# Patient Record
Sex: Female | Born: 1935 | Race: White | Hispanic: No | Smoking: Former smoker
Health system: Southern US, Community
[De-identification: ages and names within clinical notes are randomized; demographics above are authoritative.]

## PROBLEM LIST (undated history)

## (undated) DIAGNOSIS — J449 Chronic obstructive pulmonary disease, unspecified: Secondary | ICD-10-CM

## (undated) DIAGNOSIS — E785 Hyperlipidemia, unspecified: Secondary | ICD-10-CM

## (undated) DIAGNOSIS — J302 Other seasonal allergic rhinitis: Secondary | ICD-10-CM

## (undated) DIAGNOSIS — I10 Essential (primary) hypertension: Secondary | ICD-10-CM

## (undated) HISTORY — DX: Essential (primary) hypertension: I10

## (undated) HISTORY — DX: Other seasonal allergic rhinitis: J30.2

---
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
## 1973-05-29 HISTORY — PX: TOTAL ABDOMINAL HYSTERECTOMY: SHX209

## 1999-11-21 ENCOUNTER — Encounter: Payer: Self-pay | Admitting: Family Medicine

## 1999-11-21 ENCOUNTER — Encounter: Admission: RE | Admit: 1999-11-21 | Discharge: 1999-11-21 | Payer: Self-pay | Admitting: Family Medicine

## 2000-11-28 ENCOUNTER — Encounter: Admission: RE | Admit: 2000-11-28 | Discharge: 2000-11-28 | Payer: Self-pay | Admitting: Family Medicine

## 2000-11-28 ENCOUNTER — Encounter: Payer: Self-pay | Admitting: Family Medicine

## 2003-03-06 ENCOUNTER — Encounter: Admission: RE | Admit: 2003-03-06 | Discharge: 2003-03-06 | Payer: Self-pay | Admitting: Family Medicine

## 2003-03-06 ENCOUNTER — Encounter: Payer: Self-pay | Admitting: Family Medicine

## 2005-10-06 ENCOUNTER — Ambulatory Visit (HOSPITAL_COMMUNITY): Admission: RE | Admit: 2005-10-06 | Discharge: 2005-10-06 | Payer: Self-pay | Admitting: Cardiology

## 2007-09-06 ENCOUNTER — Encounter: Admission: RE | Admit: 2007-09-06 | Discharge: 2007-09-06 | Payer: Self-pay | Admitting: Obstetrics and Gynecology

## 2008-02-27 HISTORY — PX: CATARACT EXTRACTION: SUR2

## 2008-04-01 ENCOUNTER — Emergency Department (HOSPITAL_COMMUNITY): Admission: EM | Admit: 2008-04-01 | Discharge: 2008-04-01 | Payer: Self-pay | Admitting: Emergency Medicine

## 2009-12-05 ENCOUNTER — Inpatient Hospital Stay (HOSPITAL_COMMUNITY): Admission: EM | Admit: 2009-12-05 | Discharge: 2009-12-08 | Payer: Self-pay | Admitting: Emergency Medicine

## 2009-12-07 ENCOUNTER — Encounter (INDEPENDENT_AMBULATORY_CARE_PROVIDER_SITE_OTHER): Payer: Self-pay | Admitting: Internal Medicine

## 2010-06-18 ENCOUNTER — Encounter: Payer: Self-pay | Admitting: Family Medicine

## 2010-08-14 LAB — CBC
HCT: 39.3 % (ref 36.0–46.0)
HCT: 43.3 % (ref 36.0–46.0)
HCT: 39.5 % (ref 36.0–46.0)
Hemoglobin: 13.6 g/dL (ref 12.0–15.0)
Hemoglobin: 14.8 g/dL (ref 12.0–15.0)
Hemoglobin: 13.3 g/dL (ref 12.0–15.0)
MCH: 32.2 pg (ref 26.0–34.0)
MCH: 32.5 pg (ref 26.0–34.0)
MCH: 31.9 pg (ref 26.0–34.0)
MCHC: 34.1 g/dL (ref 30.0–36.0)
MCHC: 34.5 g/dL (ref 30.0–36.0)
MCHC: 33.6 g/dL (ref 30.0–36.0)
MCV: 94 fL (ref 78.0–100.0)
MCV: 94.4 fL (ref 78.0–100.0)
MCV: 95 fL (ref 78.0–100.0)
Platelets: 209 10*3/uL (ref 150–400)
Platelets: 225 10*3/uL (ref 150–400)
Platelets: 200 10*3/uL (ref 150–400)
RBC: 4.18 MIL/uL (ref 3.87–5.11)
RBC: 4.58 MIL/uL (ref 3.87–5.11)
RBC: 4.16 MIL/uL (ref 3.87–5.11)
RDW: 13 % (ref 11.5–15.5)
RDW: 13.1 % (ref 11.5–15.5)
RDW: 13.4 % (ref 11.5–15.5)
WBC: 9.4 10*3/uL (ref 4.0–10.5)
WBC: 9.6 10*3/uL (ref 4.0–10.5)
WBC: 12.2 10*3/uL — ABNORMAL HIGH (ref 4.0–10.5)

## 2010-08-14 LAB — DIFFERENTIAL
Basophils Absolute: 0 10*3/uL (ref 0.0–0.1)
Basophils Absolute: 0 10*3/uL (ref 0.0–0.1)
Basophils Relative: 1 % (ref 0–1)
Basophils Relative: 0 % (ref 0–1)
Eosinophils Absolute: 0.1 10*3/uL (ref 0.0–0.7)
Eosinophils Relative: 1 % (ref 0–5)
Lymphocytes Relative: 17 % (ref 12–46)
Lymphocytes Relative: 18 % (ref 12–46)
Lymphs Abs: 1.7 10*3/uL (ref 0.7–4.0)
Monocytes Absolute: 0.7 10*3/uL (ref 0.1–1.0)
Monocytes Relative: 7 % (ref 3–12)
Neutro Abs: 7.1 10*3/uL (ref 1.7–7.7)
Neutro Abs: 9.1 10*3/uL — ABNORMAL HIGH (ref 1.7–7.7)
Neutrophils Relative %: 74 % (ref 43–77)
Neutrophils Relative %: 74 % (ref 43–77)

## 2010-08-14 LAB — POCT I-STAT, CHEM 8
BUN: 15 mg/dL (ref 6–23)
Calcium, Ion: 1.14 mmol/L (ref 1.12–1.32)
Chloride: 100 meq/L (ref 96–112)
Creatinine, Ser: 1.1 mg/dL (ref 0.4–1.2)
Glucose, Bld: 87 mg/dL (ref 70–99)
HCT: 42 % (ref 36.0–46.0)
Hemoglobin: 14.3 g/dL (ref 12.0–15.0)
Potassium: 4.5 meq/L (ref 3.5–5.1)
Sodium: 133 meq/L — ABNORMAL LOW (ref 135–145)
TCO2: 26 mmol/L (ref 0–100)

## 2010-08-14 LAB — COMPREHENSIVE METABOLIC PANEL WITH GFR
ALT: 19 U/L (ref 0–35)
AST: 27 U/L (ref 0–37)
Albumin: 3.6 g/dL (ref 3.5–5.2)
Alkaline Phosphatase: 77 U/L (ref 39–117)
BUN: 13 mg/dL (ref 6–23)
CO2: 27 meq/L (ref 19–32)
Calcium: 9.2 mg/dL (ref 8.4–10.5)
Chloride: 99 meq/L (ref 96–112)
Creatinine, Ser: 0.95 mg/dL (ref 0.4–1.2)
GFR calc non Af Amer: 58 mL/min — ABNORMAL LOW
Glucose, Bld: 95 mg/dL (ref 70–99)
Potassium: 4.1 meq/L (ref 3.5–5.1)
Sodium: 134 meq/L — ABNORMAL LOW (ref 135–145)
Total Bilirubin: 0.6 mg/dL (ref 0.3–1.2)
Total Protein: 6.3 g/dL (ref 6.0–8.3)

## 2010-08-14 LAB — RENAL FUNCTION PANEL
Albumin: 3.2 g/dL — ABNORMAL LOW (ref 3.5–5.2)
BUN: 17 mg/dL (ref 6–23)
CO2: 26 meq/L (ref 19–32)
Calcium: 8.7 mg/dL (ref 8.4–10.5)
Chloride: 100 meq/L (ref 96–112)
Creatinine, Ser: 0.84 mg/dL (ref 0.4–1.2)
GFR calc non Af Amer: >60 mL/min
Glucose, Bld: 131 mg/dL — ABNORMAL HIGH (ref 70–99)
Phosphorus: 2.8 mg/dL (ref 2.3–4.6)
Potassium: 4.3 meq/L (ref 3.5–5.1)
Sodium: 133 meq/L — ABNORMAL LOW (ref 135–145)

## 2010-08-14 LAB — LIPID PANEL
HDL: 58 mg/dL (ref >39)
Total CHOL/HDL Ratio: 3.2 RATIO

## 2010-08-14 LAB — BASIC METABOLIC PANEL WITH GFR
BUN: 16 mg/dL (ref 6–23)
CO2: 28 meq/L (ref 19–32)
Calcium: 8.8 mg/dL (ref 8.4–10.5)
Chloride: 102 meq/L (ref 96–112)
Creatinine, Ser: 0.74 mg/dL (ref 0.4–1.2)
GFR calc non Af Amer: >60 mL/min
Glucose, Bld: 88 mg/dL (ref 70–99)
Potassium: 4.2 meq/L (ref 3.5–5.1)
Sodium: 135 meq/L (ref 135–145)

## 2010-08-14 LAB — CARDIAC PANEL(CRET KIN+CKTOT+MB+TROPI)
CK, MB: 4.3 ng/mL — ABNORMAL HIGH (ref 0.3–4.0)
Relative Index: 4 — ABNORMAL HIGH (ref 0.0–2.5)
Total CK: 107 U/L (ref 7–177)
Total CK: 119 U/L (ref 7–177)
Troponin I: 0.06 ng/mL (ref 0.00–0.06)

## 2010-08-14 LAB — POCT CARDIAC MARKERS
CKMB, poc: 2.5 ng/mL (ref 1.0–8.0)
Myoglobin, poc: 112 ng/mL (ref 12–200)
Troponin i, poc: <0.05 ng/mL (ref 0.00–0.09)
Troponin i, poc: <0.05 ng/mL (ref 0.00–0.09)

## 2010-08-14 LAB — BRAIN NATRIURETIC PEPTIDE: Pro B Natriuretic peptide (BNP): 169 pg/mL — ABNORMAL HIGH (ref 0.0–100.0)

## 2010-08-14 LAB — CK TOTAL AND CKMB (NOT AT ARMC)
CK, MB: 5.2 ng/mL — ABNORMAL HIGH (ref 0.3–4.0)
Relative Index: 4.6 — ABNORMAL HIGH (ref 0.0–2.5)
Total CK: 112 U/L (ref 7–177)

## 2010-08-14 LAB — TROPONIN I

## 2010-10-14 NOTE — Cardiovascular Report (Signed)
Jasmine Horton, Jasmine Horton                ACCOUNT NO.:  [id number]   MEDICAL RECORD NO.:  [id number]          PATIENT TYPE:  OIB   LOCATION:  2899                         FACILITY:  [facility]   PHYSICIAN:  Fransico Him, M.D.     DATE OF BIRTH:  1934-05-05   DATE OF PROCEDURE:  10/06/2005  DATE OF DISCHARGE:  10/06/2005                              CARDIAC CATHETERIZATION   PROCEDURE:  Left heart catheterization, coronary angiography, left  ventriculography.   SURGEON:  Fransico Him, M.D.   INDICATIONS:  Left arm pain, abnormal Cardiolite.   COMPLICATIONS:  None.   IV MEDICATIONS:  Versed 2 mg IV.   This is a very pleasant 77 year old white female who has been having some  chronic problems with left shoulder pain and underwent stress Cardiolite  study which was abnormal.  She now presents for cardiac catheterization.   The patient is brought to the cardiac catheterization laboratory in a  fasting nonsedated state.  Informed consent was obtained.  The patient was  connected to continuous heart rate and pulse oximetry monitoring and  intermittent blood pressure monitoring. The right groin was prepped and  draped in a sterile fashion. 1% Xylocaine was used for local anesthesia.  Using the modified Seldinger technique, a 6-French sheath was placed in the  right femoral artery.  Under fluoroscopic guidance, a 6-French JL-4 catheter  was placed left in the coronary artery.  Multiple cine films were taken in  30 degree RAO and 40 degree LAO views.  This catheter was exchanged out over  a guidewire for a 6-French JR-4 catheter which was placed under fluoroscopic  guidance to the right coronary artery.  Multiple cine films were taken in 30  RAO and 40 LAO views.  This catheter was exchanged out over a guidewire for  a 6-French angled pigtail catheter which was placed under fluoroscopic  guidance in the left ventricular cavity.  Left ventriculography was  performed in a 30 degree RAO view using a  total of 30 mL of contrast at 15  mL per second. The catheter was then pulled back across the aortic valve  with no significant gradient noted.  At the end of the procedure, all  catheters and sheaths were removed.  Manual compression was performed until  adequate hemostasis was obtained.  The patient was transferred back to room  in stable condition.   RESULTS:  The left main coronary is widely patent and bifurcates into the  left anterior descending artery and left circumflex artery.   The left anterior descending artery is widely patent throughout its course  to the apex giving rise to one diagonal branch which is widely patent.   The left circumflex is widely patent throughout its course in the AV groove  giving rise to four obtuse marginal branches all of which are widely patent.   The right coronary artery is widely patent throughout its course bifurcating  distally in the posterior descending artery and posterior lateral artery  both of which are widely patent.   Left ventriculography shows normal LV systolic function, EF 29%, aortic  pressure 148/70  mmHg, LV pressure was 144/5 mmHg, LVEDP 13 mmHg.   ASSESSMENT:  1.  Noncardiac arm pain.  2.  Normal coronary arteries.  3.  Normal LV function.   PLAN:  Discharge to home after IV fluid and bedrest. Follow-up with be in 2  weeks.      Fransico Him, M.D.  Electronically Signed     TT/MEDQ  D:  10/10/2005  T:  10/10/2005  Job:  [id number]

## 2011-02-28 LAB — URINALYSIS, ROUTINE W REFLEX MICROSCOPIC
Hgb urine dipstick: NEGATIVE
Nitrite: NEGATIVE
Protein, ur: NEGATIVE
Specific Gravity, Urine: 1.009
Urobilinogen, UA: 1

## 2011-02-28 LAB — BASIC METABOLIC PANEL
BUN: 13
CO2: 25
Calcium: 9.8
Chloride: 98
Creatinine, Ser: 1.17

## 2011-02-28 LAB — CBC
HCT: 46.5 — ABNORMAL HIGH
MCHC: 33.3
MCV: 93.4
Platelets: 224
RBC: 4.98
RDW: 13.7

## 2011-02-28 LAB — POCT CARDIAC MARKERS: Troponin i, poc: <0.05

## 2011-02-28 LAB — URINE CULTURE

## 2011-02-28 LAB — DIFFERENTIAL
Eosinophils Absolute: 0
Eosinophils Relative: 0
Lymphocytes Relative: 14
Lymphs Abs: 1.3
Monocytes Relative: 8
Neutro Abs: 7.2
Neutrophils Relative %: 77

## 2011-02-28 LAB — MAGNESIUM: Magnesium: 1.8

## 2014-07-04 ENCOUNTER — Encounter: Payer: Self-pay | Admitting: *Deleted

## 2014-07-16 ENCOUNTER — Emergency Department (HOSPITAL_COMMUNITY)
Admission: EM | Admit: 2014-07-16 | Discharge: 2014-07-17 | Disposition: A | Discharge status: Home / Self Care | Payer: Medicare Other | Attending: Emergency Medicine | Admitting: Emergency Medicine

## 2014-07-16 ENCOUNTER — Other Ambulatory Visit: Payer: Self-pay

## 2014-07-16 ENCOUNTER — Emergency Department (HOSPITAL_COMMUNITY): Payer: Medicare Other

## 2014-07-16 ENCOUNTER — Encounter (HOSPITAL_COMMUNITY): Payer: Self-pay | Admitting: *Deleted

## 2014-07-16 DIAGNOSIS — Z880 Allergy status to penicillin: Secondary | ICD-10-CM | POA: Insufficient documentation

## 2014-07-16 DIAGNOSIS — J441 Chronic obstructive pulmonary disease with (acute) exacerbation: Secondary | ICD-10-CM

## 2014-07-16 DIAGNOSIS — I10 Essential (primary) hypertension: Secondary | ICD-10-CM | POA: Diagnosis not present

## 2014-07-16 DIAGNOSIS — Z87891 Personal history of nicotine dependence: Secondary | ICD-10-CM | POA: Insufficient documentation

## 2014-07-16 DIAGNOSIS — R0602 Shortness of breath: Secondary | ICD-10-CM | POA: Diagnosis present

## 2014-07-16 LAB — TROPONIN I: TROPONIN I: 0.04 ng/mL — AB (ref <0.031)

## 2014-07-16 LAB — BRAIN NATRIURETIC PEPTIDE: B Natriuretic Peptide: 151.4 pg/mL — ABNORMAL HIGH (ref 0.0–100.0)

## 2014-07-16 LAB — COMPREHENSIVE METABOLIC PANEL
ALT: 18 U/L (ref 0–35)
AST: 26 U/L (ref 0–37)
Albumin: 3.8 g/dL (ref 3.5–5.2)
Alkaline Phosphatase: 91 U/L (ref 39–117)
Anion gap: 6 (ref 5–15)
BILIRUBIN TOTAL: 0.4 mg/dL (ref 0.3–1.2)
BUN: 17 mg/dL (ref 6–23)
CO2: 33 mmol/L — AB (ref 19–32)
Calcium: 9.5 mg/dL (ref 8.4–10.5)
Chloride: 102 mmol/L (ref 96–112)
Creatinine, Ser: 1.06 mg/dL (ref 0.50–1.10)
GFR calc Af Amer: 57 mL/min — ABNORMAL LOW (ref >90)
GFR, EST NON AFRICAN AMERICAN: 49 mL/min — AB (ref >90)
Glucose, Bld: 102 mg/dL — ABNORMAL HIGH (ref 70–99)
Potassium: 4.2 mmol/L (ref 3.5–5.1)
Sodium: 141 mmol/L (ref 135–145)
Total Protein: 6.7 g/dL (ref 6.0–8.3)

## 2014-07-16 LAB — CBC
HEMATOCRIT: 41.9 % (ref 36.0–46.0)
HEMOGLOBIN: 13.4 g/dL (ref 12.0–15.0)
MCH: 29.5 pg (ref 26.0–34.0)
MCHC: 32 g/dL (ref 30.0–36.0)
MCV: 92.3 fL (ref 78.0–100.0)
Platelets: 225 10*3/uL (ref 150–400)
RBC: 4.54 MIL/uL (ref 3.87–5.11)
RDW: 13.7 % (ref 11.5–15.5)
WBC: 9.4 10*3/uL (ref 4.0–10.5)

## 2014-07-16 MED ORDER — ALBUTEROL (5 MG/ML) CONTINUOUS INHALATION SOLN
10.0000 mg/h | INHALATION_SOLUTION | Freq: Once | RESPIRATORY_TRACT | Status: AC
  Administered 2014-07-16: 10 mg/h via RESPIRATORY_TRACT
  Filled 2014-07-16: qty 20

## 2014-07-16 MED ORDER — PREDNISONE 20 MG PO TABS
60.0000 mg | ORAL_TABLET | Freq: Once | ORAL | Status: AC
  Administered 2014-07-16: 60 mg via ORAL
  Filled 2014-07-16: qty 3

## 2014-07-16 NOTE — ED Notes (Addendum)
Pt. Reports cough since mid-November that "started as bronchitis" and has progressively gotten worse and is worse she lays flat. Reports some production, states in the morning it is white/foamy and at night it is dark green. Reports SOB but states that is normal for her. Pt. Also states she has been out of HTN medication x1 week.

## 2014-07-16 NOTE — ED Notes (Signed)
Pt sent here from UC for elevated bp.  She initially went there for a cough x 2 weeks that has been preventing her from sleeping.  Pt is very sob, sats of 91% with dyspnea at rest.  Denies chest pain.  Bil LE edema, though pt states that is not new.

## 2014-07-16 NOTE — ED Notes (Signed)
Jasmine Horton- [id number]

## 2014-07-16 NOTE — ED Notes (Signed)
Upon completion of breathing treatment, pt noted to be 88% on RA. Pt placed on 2L [place].  Breath sounds clear

## 2014-07-16 NOTE — ED Provider Notes (Signed)
CSN: [id number]     Arrival date & time 07/16/14  1704 History   First MD Initiated Contact with Patient 07/16/14 1747     Chief Complaint  Patient presents with  . Shortness of Breath  . Hypertension     (Consider location/radiation/quality/duration/timing/severity/associated sxs/prior Treatment) HPI   Jasmine Horton is a 81 y.o. female here for evaluation of cough which is worse at night, and elevated blood pressure.  She is not currently seeing her doctor, and is not currently taking any medications.  She ran out of her blood pressure medicines.  She stopped smoking 5 months ago.  She has a cough productive of white and green sputum.  Her cough is worse when she is supine.  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  She denies fever, chills, nausea, vomiting, weakness or dizziness.  She denies chronic asthma or bronchitis.  There are no other known modifying factors.   Past Medical History  Diagnosis Date  . Hypertension   . Seasonal allergies    Past Surgical History  Procedure Laterality Date  . Cataract extraction Left 02/2008  . Total abdominal hysterectomy  1975  . Appendectomy  1953   Family History  Problem Relation Age of Onset  . Dementia Mother   . Emphysema Father    History  Substance Use Topics  . Smoking status: Former Smoker    Types: Cigarettes    Quit date: 04/15/2014  . Smokeless tobacco: Not on file  . Alcohol Use: Yes   OB History    No data available     Review of Systems  All other systems reviewed and are negative.     Allergies  Penicillins  Home Medications   Prior to Admission medications   Medication Sig Start Date End Date Taking? Authorizing Provider  acetaminophen (TYLENOL) 325 MG tablet Take 650 mg by mouth every 6 (six) hours as needed.   Yes Historical Provider, MD  Throat Lozenges (COUGH DROPS MT) Use as directed 2 tablets in the mouth or throat daily as needed (cough).   Yes Historical Provider, MD  olmesartan-hydrochlorothiazide  (BENICAR HCT) 40-12.5 MG per tablet Take 1 tablet by mouth daily. 07/17/14   Richarda Blade, MD  predniSONE (DELTASONE) 10 MG tablet Take q day 6,5,4,3,2,1 07/17/14   Richarda Blade, MD   BP 139/57 mmHg  Pulse 75  Temp(Src) 98.1 F (36.7 C) (Oral)  Resp 15  Ht _0  (1.626 m)  Wt 97 lb (43.999 kg)  BMI 16.64 kg/m2  SpO2 98% Physical Exam  Constitutional: She is oriented to person, place, and time. She appears well-developed. She appears distressed (Increased work of breathing).  Elderly, frail  HENT:  Head: Normocephalic and atraumatic.  Right Ear: External ear normal.  Left Ear: External ear normal.  Eyes: Conjunctivae and EOM are normal. Pupils are equal, round, and reactive to light.  Neck: Normal range of motion and phonation normal. Neck supple.  Cardiovascular: Normal rate, regular rhythm and normal heart sounds.   Pulmonary/Chest: She is in respiratory distress (mild with intercostal retractions, and abdominal breathing). She has wheezes. She has no rales. She exhibits no tenderness and no bony tenderness.  Abdominal: Soft. There is no tenderness.  Musculoskeletal: Normal range of motion.  Neurological: She is alert and oriented to person, place, and time. No cranial nerve deficit or sensory deficit. She exhibits normal muscle tone. Coordination normal.  Skin: Skin is warm, dry and intact.  Psychiatric: She has a normal mood and  affect. Her behavior is normal. Judgment and thought content normal.  Nursing note and vitals reviewed.   ED Course  Procedures (including critical care time)  Medications  albuterol (PROVENTIL HFA;VENTOLIN HFA) 108 (90 BASE) MCG/ACT inhaler 2 puff (not administered)  aerochamber Z-Stat Plus/medium 1 each (not administered)  albuterol (PROVENTIL,VENTOLIN) solution continuous neb (10 mg/hr Nebulization Given 07/16/14 2033)  predniSONE (DELTASONE) tablet 60 mg (60 mg Oral Given 07/16/14 2036)    Patient Vitals for the past 24 hrs:  BP Temp Temp  src Pulse Resp SpO2 Height Weight  07/17/14 0045 139/57 mmHg - - 75 - 98 % - -  07/17/14 0030 122/59 mmHg - - 65 - 91 % - -  07/17/14 0015 127/58 mmHg - - 68 - 93 % - -  07/17/14 0000 125/60 mmHg - - 77 - 95 % - -  07/16/14 2345 142/62 mmHg - - 72 - 95 % - -  07/16/14 2330 132/61 mmHg - - 91 - 96 % - -  07/16/14 2315 124/55 mmHg - - 88 - 96 % - -  07/16/14 2300 121/57 mmHg - - 91 - 97 % - -  07/16/14 2258 139/65 mmHg 98.1 F (36.7 C) Oral 95 15 97 % - -  07/16/14 2230 120/67 mmHg - - 104 - 98 % - -  07/16/14 2200 148/67 mmHg - - 111 - 91 % - -  07/16/14 2130 154/73 mmHg - - 92 - 100 % - -  07/16/14 2100 173/73 mmHg - - 75 - 100 % - -  07/16/14 2045 175/87 mmHg - - 95 - 100 % - -  07/16/14 2034 - - - - - 100 % - -  07/16/14 2030 174/92 mmHg - - 93 - 98 % - -  07/16/14 1946 154/71 mmHg - - 91 (!) 27 95 % - -  07/16/14 1800 172/99 mmHg - - 92 24 98 % - -  07/16/14 1727 (!) 196/107 mmHg 98.3 F (36.8 C) Oral 102 16 91 % _0  (1.626 m) 97 lb (43.999 kg)    11:26 PM Reevaluation with update and discussion. After initial assessment and treatment, an updated evaluation reveals she is more comfortable now.  She has decreased work of breathing, and is resting comfortably on the stretcher in the semi-Fowler's position.  She is not coughing now and states that she is getting some rest.  Delta troponin ordered.  Will reevaluate for additional needs, when that result returns. Naethan Bracewell L     Labs Review Labs Reviewed  TROPONIN I - Abnormal; Notable for the following:    Troponin I 0.04 (*)    All other components within normal limits  COMPREHENSIVE METABOLIC PANEL - Abnormal; Notable for the following:    CO2 33 (*)    Glucose, Bld 102 (*)    GFR calc non Af Amer 49 (*)    GFR calc Af Amer 57 (*)    All other components within normal limits  BRAIN NATRIURETIC PEPTIDE - Abnormal; Notable for the following:    B Natriuretic Peptide 151.4 (*)    All other components within normal limits   CBC  TROPONIN I    CRITICAL CARE Performed by: Daleen Bo L Total critical care time: 45 minutes Critical care time was exclusive of separately billable procedures and treating other patients. Critical care was necessary to treat or prevent imminent or life-threatening deterioration. Critical care was time spent personally by me on the following activities: development of treatment plan  with patient and/or surrogate as well as nursing, discussions with consultants, evaluation of patient's response to treatment, examination of patient, obtaining history from patient or surrogate, ordering and performing treatments and interventions, ordering and review of laboratory studies, ordering and review of radiographic studies, pulse oximetry and re-evaluation of patient's condition.   Imaging Review Dg Chest 2 View (if Patient Has Fever And/or Copd)  07/16/2014   CLINICAL DATA:  Intermittent chest pain and shortness of breath for 3 months.  EXAM: CHEST  2 VIEW  COMPARISON:  CT chest 12/05/2009.  PA and lateral chest 05/01/2014.  FINDINGS: The lungs are markedly emphysematous but clear. Heart size is mildly enlarged. There is no pneumothorax or pleural effusion. Convex left scoliosis is noted.  IMPRESSION: Severe emphysema without acute disease.   Electronically Signed   By: Inge Rise M.D.   On: 07/16/2014 18:58     EKG Interpretation   Date/Time:  Thursday July 16 2014 17:38:21 EST Ventricular Rate:  105 PR Interval:  152 QRS Duration: 74 QT Interval:  336 QTC Calculation: 444 R Axis:   105 Text Interpretation:  Sinus tachycardia Biatrial enlargement Rightward  axis Pulmonary disease pattern T wave abnormality, consider anterior  ischemia Abnormal ECG Since last tracing QRS axis shift and new  biatrial  enlargement Confirmed by Eulis Foster  MD, Vira Agar ([id number]) on 07/16/2014 5:54:32  PM      MDM   Final diagnoses:  COPD exacerbation    COPD exacerbation, with history of  bronchitis, but no history of ongoing chronic bronchitis.  She stopped smoking 5 months ago.  Doubt ACS, PE or pneumonia.  Nursing Notes Reviewed/ Care Coordinated Applicable Imaging Reviewed Interpretation of Laboratory Data incorporated into ED treatment  The patient appears reasonably screened and/or stabilized for discharge and I doubt any other medical condition or other [facility] requiring further screening, evaluation, or treatment in the ED at this time prior to discharge.  Plan: Home Medications- Prednisone, Refill BP med, Inhaler ; Home Treatments- rest; return here if the recommended treatment, does not improve the symptoms; Recommended follow up- PCP f/u asap   Richarda Blade, MD 07/17/14 [phone]

## 2014-07-17 LAB — TROPONIN I: TROPONIN I: 0.03 ng/mL (ref <0.031)

## 2014-07-17 MED ORDER — PREDNISONE 10 MG PO TABS: ORAL_TABLET | ORAL | Status: DC

## 2014-07-17 MED ORDER — OLMESARTAN MEDOXOMIL-HCTZ 40-12.5 MG PO TABS: 1.0000 | ORAL_TABLET | Freq: Every day | ORAL | Status: DC

## 2014-07-17 MED ORDER — ALBUTEROL SULFATE HFA 108 (90 BASE) MCG/ACT IN AERS
2.0000 | INHALATION_SPRAY | RESPIRATORY_TRACT | Status: DC | PRN
  Administered 2014-07-17: 2 via RESPIRATORY_TRACT
  Filled 2014-07-17: qty 6.7

## 2014-07-17 MED ORDER — AEROCHAMBER Z-STAT PLUS/MEDIUM MISC
1.0000 | Freq: Once | Status: AC
  Administered 2014-07-17: 1
  Filled 2014-07-17: qty 1

## 2014-07-17 NOTE — Discharge Instructions (Signed)
Use the inhaler 2 puffs every 2-3 hours as needed for cough or trouble breathing. Start the prednisone prescription tomorrow. Use the resource guide to find a doctor to see for further care and treatment as soon as possible. Do not stop taking her blood pressure medicine even less, a doctor advises you to do so.   Chronic Obstructive Pulmonary Disease Exacerbation Chronic obstructive pulmonary disease (COPD) is a common lung condition in which airflow from the lungs is limited. COPD is a general term that can be used to describe many different lung problems that limit airflow, including chronic bronchitis and emphysema. COPD exacerbations are episodes when breathing symptoms become much worse and require extra treatment. Without treatment, COPD exacerbations can be life threatening, and frequent COPD exacerbations can cause further damage to your lungs. CAUSES  1. Respiratory infections.  2. Exposure to smoke.  3. Exposure to air pollution, chemical fumes, or dust. Sometimes there is no apparent cause or trigger. RISK FACTORS 1. Smoking cigarettes. 2. Older age. 3. Frequent prior COPD exacerbations. SIGNS AND SYMPTOMS   Increased coughing.   Increased thick spit (sputum) production.   Increased wheezing.   Increased shortness of breath.   Rapid breathing.   Chest tightness. DIAGNOSIS  Your medical history, a physical exam, and tests will help your health care provider make a diagnosis. Tests may include:  A chest X-ray.  Basic lab tests.  Sputum testing.  An arterial blood gas test. TREATMENT  Depending on the severity of your COPD exacerbation, you may need to be admitted to a hospital for treatment. Some of the treatments commonly used to treat COPD exacerbations are:   Antibiotic medicines.   Bronchodilators. These are drugs that expand the air passages. They may be given with an inhaler or nebulizer. Spacer devices may be needed to help improve drug  delivery.  Corticosteroid medicines.  Supplemental oxygen therapy.  HOME CARE INSTRUCTIONS   Do not smoke. Quitting smoking is very important to prevent COPD from getting worse and exacerbations from happening as often.  Avoid exposure to all substances that irritate the airway, especially to tobacco smoke.   If you were prescribed an antibiotic medicine, finish it all even if you start to feel better.  Take all medicines as directed by your health care provider.It is important to use correct technique with inhaled medicines.  Drink enough fluids to keep your urine clear or pale yellow (unless you have a medical condition that requires fluid restriction).  Use a cool mist vaporizer. This makes it easier to clear your chest when you cough.   If you have a home nebulizer and oxygen, continue to use them as directed.   Maintain all necessary vaccinations to prevent infections.   Exercise regularly.   Eat a healthy diet.   Keep all follow-up appointments as directed by your health care provider. SEEK IMMEDIATE MEDICAL CARE IF:  You have worsening shortness of breath.   You have trouble talking.   You have severe chest pain.  You have blood in your sputum.  You have a fever.  You have weakness, vomit repeatedly, or faint.   You feel confused.   You continue to get worse. MAKE SURE YOU:   Understand these instructions.  Will watch your condition.  Will get help right away if you are not doing well or get worse. Document Released: 03/12/2007 Document Revised: 09/29/2013 Document Reviewed: 01/17/2013 [facility] Patient Information 2015 [place], [place]. This information is not intended to replace advice given to you  by your health care provider. Make sure you discuss any questions you have with your health care provider.  How to Use an Inhaler Proper inhaler technique is very important. Good technique ensures that the medicine reaches the lungs. Poor  technique results in depositing the medicine on the tongue and back of the throat rather than in the airways. If you do not use the inhaler with good technique, the medicine will not help you. STEPS TO FOLLOW IF USING AN INHALER WITHOUT AN EXTENSION TUBE 4. Remove the cap from the inhaler. 5. If you are using the inhaler for the first time, you will need to prime it. Shake the inhaler for 5 seconds and release four puffs into the air, away from your face. Ask your health care provider or pharmacist if you have questions about priming your inhaler. 6. Shake the inhaler for 5 seconds before each breath in (inhalation). 7. Position the inhaler so that the top of the canister faces up. 8. Put your index finger on the top of the medicine canister. Your thumb supports the bottom of the inhaler. 9. Open your mouth. 10. Either place the inhaler between your teeth and place your lips tightly around the mouthpiece, or hold the inhaler 1-2 inches away from your open mouth. If you are unsure of which technique to use, ask your health care provider. 11. Breathe out (exhale) normally and as completely as possible. 12. Press the canister down with your index finger to release the medicine. 13. At the same time as the canister is pressed, inhale deeply and slowly until your lungs are completely filled. This should take 4-6 seconds. Keep your tongue down. 14. Hold the medicine in your lungs for 5-10 seconds (10 seconds is best). This helps the medicine get into the small airways of your lungs. 15. Breathe out slowly, through pursed lips. Whistling is an example of pursed lips. 16. Wait at least 15-30 seconds between puffs. Continue with the above steps until you have taken the number of puffs your health care provider has ordered. Do not use the inhaler more than your health care provider tells you. 17. Replace the cap on the inhaler. 18. Follow the directions from your health care provider or the inhaler insert for  cleaning the inhaler. STEPS TO FOLLOW IF USING AN INHALER WITH AN EXTENSION (SPACER) 4. Remove the cap from the inhaler. 5. If you are using the inhaler for the first time, you will need to prime it. Shake the inhaler for 5 seconds and release four puffs into the air, away from your face. Ask your health care provider or pharmacist if you have questions about priming your inhaler. 6. Shake the inhaler for 5 seconds before each breath in (inhalation). 7. Place the open end of the spacer onto the mouthpiece of the inhaler. 8. Position the inhaler so that the top of the canister faces up and the spacer mouthpiece faces you. 9. Put your index finger on the top of the medicine canister. Your thumb supports the bottom of the inhaler and the spacer. 10. Breathe out (exhale) normally and as completely as possible. 11. Immediately after exhaling, place the spacer between your teeth and into your mouth. Close your lips tightly around the spacer. 12. Press the canister down with your index finger to release the medicine. 13. At the same time as the canister is pressed, inhale deeply and slowly until your lungs are completely filled. This should take 4-6 seconds. Keep your tongue down and out  of the way. 14. Hold the medicine in your lungs for 5-10 seconds (10 seconds is best). This helps the medicine get into the small airways of your lungs. Exhale. 15. Repeat inhaling deeply through the spacer mouthpiece. Again hold that breath for up to 10 seconds (10 seconds is best). Exhale slowly. If it is difficult to take this second deep breath through the spacer, breathe normally several times through the spacer. Remove the spacer from your mouth. 16. Wait at least 15-30 seconds between puffs. Continue with the above steps until you have taken the number of puffs your health care provider has ordered. Do not use the inhaler more than your health care provider tells you. 17. Remove the spacer from the inhaler, and place  the cap on the inhaler. 18. Follow the directions from your health care provider or the inhaler insert for cleaning the inhaler and spacer. If you are using different kinds of inhalers, use your quick relief medicine to open the airways 10-15 minutes before using a steroid if instructed to do so by your health care provider. If you are unsure which inhalers to use and the order of using them, ask your health care provider, nurse, or respiratory therapist. If you are using a steroid inhaler, always rinse your mouth with water after your last puff, then gargle and spit out the water. Do not swallow the water. AVOID:  Inhaling before or after starting the spray of medicine. It takes practice to coordinate your breathing with triggering the spray.  Inhaling through the nose (rather than the mouth) when triggering the spray. HOW TO DETERMINE IF YOUR INHALER IS FULL OR NEARLY EMPTY You cannot know when an inhaler is empty by shaking it. A few inhalers are now being made with dose counters. Ask your health care provider for a prescription that has a dose counter if you feel you need that extra help. If your inhaler does not have a counter, ask your health care provider to help you determine the date you need to refill your inhaler. Write the refill date on a calendar or your inhaler canister. Refill your inhaler 7-10 days before it runs out. Be sure to keep an adequate supply of medicine. This includes making sure it is not expired, and that you have a spare inhaler.  SEEK MEDICAL CARE IF:   Your symptoms are only partially relieved with your inhaler.  You are having trouble using your inhaler.  You have some increase in phlegm. SEEK IMMEDIATE M  EDICAL CARE IF:   You feel little or no relief with your inhalers. You are still wheezing and are feeling shortness of breath or tightness in your chest or both.  You have dizziness, headaches, or a fast heart rate.  You have chills, fever, or night  sweats.  You have a noticeable increase in phlegm production, or there is blood in the phlegm. MAKE SURE YOU:   Understand these instructions.  Will watch your condition.  Will get help right away if you are not doing well or get worse. Document Released: 05/12/2000 Document Revised: 03/05/2013 Document Reviewed: 12/12/2012 [facility] Patient Information 2015 [place], [place]. This information is not intended to replace advice given to you by your health care provider. Make sure you discuss any questions you have with your health care provider.    Emergency Department Resource Guide 1) Find a Doctor and Pay Out of Pocket Although you won't have to find out who is covered by your insurance plan, it is  a good idea to ask around and get recommendations. You will then need to call the office and see if the doctor you have chosen will accept you as a new patient and what types of options they offer for patients who are self-pay. Some doctors offer discounts or will set up payment plans for their patients who do not have insurance, but you will need to ask so you aren't surprised when you get to your appointment.  2) Contact Your Local Health Department Not all health departments have doctors that can see patients for sick visits, but many do, so it is worth a call to see if yours does. If you don't know where your local health department is, you can check in your phone book. The CDC also has a tool to help you locate your state's health department, and many state websites also have listings of all of their local health departments.  3) Find a [facility] If your illness is not likely to be very severe or complicated, you may want to try a walk in clinic. These are popping up all over the country in pharmacies, drugstores, and shopping centers. They're usually staffed by nurse practitioners or physician assistants that have been trained to treat common illnesses and complaints. They're usually  fairly quick and inexpensive. However, if you have serious medical issues or chronic medical problems, these are probably not your best option.  No Primary Care Doctor: - Call Health Connect at  [phone] - they can help you locate a primary care doctor that  accepts your insurance, provides certain services, etc. - Physician Referral Service- [phone]  Chronic Pain Problems: Organization         Address  Phone   Notes  [facility]  [phone] Patients need to be referred by their primary care doctor.   Medication Assistance: Organization         Address  Phone   Notes  [facility] Medication [facility] [address] [place], [place] [address] [phone] --Must be a resident of [facility] -- Must have NO insurance coverage whatsoever (no Medicaid/ Medicare, etc.) -- The pt. MUST have a primary care doctor that directs their care regularly and follows them in the community   [facility]ist  [phone]   [employer]  [phone]    Agencies that provide inexpensive medical care: Organization         Address  Phone   Notes  [place]  [phone]   [facility]    [phone]   [facility] [place], [place] [address] [phone]   [place] [address], [place] [phone]   Planned Parenthood    [phone]   [facility]    [phone]   [place] and [address], [place] Phone:  [phone], Fax:  [phone] Hours of Operation:  9 am - 6 pm, M-F.  Also accepts Medicaid/Medicare and self-pay.  [facility] for [place] [address], [address], [place] Phone: [phone], Fax: [phone]. Hours of Operation:  8:30 am - 5:30 pm, M-F.  Also accepts Medicaid and self-pay.  [facility] [place] [address] [place] Phone: [phone]   [place], [place], [place] [phone], Ext. [phone] Mondays & Thursdays: 7-9 AM.  First 15 patients are seen on a first come, first serve basis.    [place] Providers:  Organization         Address  Phone   Notes  [facility] [address], [place] [phone] Also accepts self-pay patients.  [facility] [address], [place]  [phone]   [place], [address], [place] [phone]   [facility] [address], [place] [phone]   Lucianne Lei [address], [place]   [phone] Only accepts [place] Access [place] patients after they have their name applied to their card.   Self-Pay (no insurance) in [facility]:  Organization         Address  Phone   Notes  Sickle Cell Patients, [facility] [place] [phone]   [facility] [place] [phone]   [facility] [place]  [place], [place], [place] [phone]   [facility]/Dr. Osei-Bonsu  [address], [place] or [address], [place] [phone] Phone number for both [place] and [place] locations is the same.  [facility] [address] [place] [phone]   [facility] [address], [place] or [address] [phone]   [facility] [address], [place] [phone], phone; [phone], fax Sees patients 1st and 3rd Saturday of every month.  Must not qualify for public or private insurance (i.e. Medicaid, Medicare, [place] Health Choice, Veterans' Benefits)  Household income should be no more than 200% of the poverty level The clinic cannot treat you if  you are pregnant or think you are pregnant  Sexually transmitted diseases are not treated at the clinic.    Dental Care: Organization         Address  Phone  Notes  [facility] Department of [facility] [phone] Accepts children up to age 48 who are enrolled in [place] or [place]; pregnant women with a Medicaid card; and children who have applied for Medicaid or [place] Health Choice, but were declined, whose parents can pay a reduced fee at time of service.  [facility] Department of [facility]  [address], [place] [phone] Accepts children up to age 18 who are enrolled in [place] or [place]; pregnant women with a Medicaid card; and children who have applied for Medicaid or [place] Health Choice, but were declined, whose parents can pay a reduced fee at time of service.  [facility] Access PRO[facility]  [place] [phone] Patients are seen by appointment only. Walk-ins are not accepted. [place] will see patients 24 years of age and older. Monday - Tuesday (8am-5pm) Most Wednesdays (8:30-5pm) $30 per visit, cash only  [facility] Adult Dental Access PROGRAM  [address], [facility] [phone] Patients are seen by appointment only. Walk-ins are not accepted. [place] will see patients 74 years of age and older. One Wednesday Evening (Monthly: Volunteer Based).  $30 per visit, cash only  [place]  [phone] for adults; Children under age 23, call Graduate Pediatric  Dentistry at [phone]. Children aged 57-14, please call [phone] to request a pediatric application.  Dental services are provided in all areas of dental care including fillings, crowns and bridges, complete and partial dentures, implants, gum treatment, root canals, and extractions. Preventive care is also provided. Treatment is  provided to both adults and children. Patients are selected via a lottery and there is often a waiting list.   [facility] [address] [place]  [phone] [email or web address]   [facility] [address] [place], [place] [phone], Ext. [phone] Second and Fourth Thursday of each month, opens at 6:30 AM; Clinic ends at 9 AM.  Patients are seen on a first-come first-served basis, and a limited number are seen during each clinic.   [facility]  [address] [place]er [place], [place] [phone]   Eligibility Requirements You must have lived in [place], [place], or [place] counties for at least the last three months.   You cannot be eligible for state or federal sponsored [employer], including [employer], [place], or [employer].   You generally cannot be eligible for healthcare insurance through your employer.    How to apply: Eligibility screenings are held every Tuesday and Wednesday afternoon from 1:00 pm until 4:00 pm. You do not need an appointment for the interview!  [facility] [address] [place], [place]   [place]  [place] Department  [place]  [phone]    Behavioral Health Resources in the Community: Intensive Outpatient Programs Organization         Address  Phone  Notes  [place]. [address], [place], [place] [phone]   [facility] Outpatient [address] [place], [place]   ADS: Alcohol & Drug Svcs [address], [place], [place]   [place] [address],  [place], [place] or [phone]   Substance Abuse Resources Organization         Address  Phone  Notes  Alcohol and Drug Services  [phone]   [place]  [phone]   The  [place]   Chinita Pester  [phone]   Residential & Outpatient Substance Abuse Program  [phone]   Psychological Services Organization         Address  Phone  Notes  [facility] [place]  [place]  [phone]   [place] [address] [place] or [phone]    Mobile Crisis Teams Organization         Address  Phone  Notes  [facility], Mobile Crisis Care Unit  [phone]   [facility]ive Psychotherapeutic Services  [address] [place], [place]   [place] [address], [place] [phone]    Self-Help/Support Groups Organization         Address  Phone             Notes  [place]. of [place] - variety of support groups  [place] Call for more information  Narcotics Anonymous (NA), [facility] [address], [employer] [place]  2 meetings at this location   Special educational needs teacher         Address  Phone  Notes  [facility]idential Treatment [place],  [place]  [place]  [address], [place] [phone], [place], [place]   [place], [place] [phone] Admissions: 8am-3pm M-F  Incentives Substance [place] [address],    [place], [place] [phone]   [facility] [address] [place], [place], [place]   [facility] [address],  [place], [place]   [facility] - Intensive Outpatient [address] Kristeen [address], [place], [place]   [facility] [place].,  Institute, [place] [phone] or [phone]   [facility] Services (RTS) [address] [place], [place] Accepts Medicaid  [facility] [place] [address]  [place] [phone] Substance Abuse/Addiction Treatment     [facility] Organization         Address  Phone  Notes  [facility]  [phone]   Domenic Schwab, PhD [address] Arlis Porta [place], [place]   [phone] or [phone]   [place], [place] [phone]   [facility] [address] [place], [place] [phone] Insurance/Medicaid/sponsorship through [facility] and Families [address], [address] [place]                                    [place], [place] [phone] [place] [address], [place] [phone]    Dr. Adele Schilder  [phone]   [facility] Dept. 1) [address] [place] 2) Luis Lopez 3)  [place] [address], [place] [phone]  [phone]   [place] [phone] or [phone] (After Hours)

## 2014-07-17 NOTE — ED Notes (Signed)
Patient is alert and orientedx4.  Patient was explained discharge instructions and they understood them with no questions.  The patient's son, Charlie Pitter is taking the patient home.

## 2015-02-26 ENCOUNTER — Emergency Department (HOSPITAL_COMMUNITY): Payer: Medicare Other

## 2015-02-26 ENCOUNTER — Observation Stay (HOSPITAL_COMMUNITY)
Admission: EM | Admit: 2015-02-26 | Discharge: 2015-02-28 | Disposition: A | Discharge status: Home / Self Care | Payer: Medicare Other | Attending: Oncology | Admitting: Oncology

## 2015-02-26 ENCOUNTER — Encounter (HOSPITAL_COMMUNITY): Payer: Self-pay | Admitting: *Deleted

## 2015-02-26 DIAGNOSIS — I7419 Embolism and thrombosis of other parts of aorta: Secondary | ICD-10-CM | POA: Insufficient documentation

## 2015-02-26 DIAGNOSIS — J431 Panlobular emphysema: Principal | ICD-10-CM | POA: Insufficient documentation

## 2015-02-26 DIAGNOSIS — J449 Chronic obstructive pulmonary disease, unspecified: Secondary | ICD-10-CM

## 2015-02-26 DIAGNOSIS — I2723 Pulmonary hypertension due to lung diseases and hypoxia: Secondary | ICD-10-CM | POA: Insufficient documentation

## 2015-02-26 DIAGNOSIS — R0902 Hypoxemia: Secondary | ICD-10-CM

## 2015-02-26 DIAGNOSIS — Z880 Allergy status to penicillin: Secondary | ICD-10-CM | POA: Insufficient documentation

## 2015-02-26 DIAGNOSIS — I272 Other secondary pulmonary hypertension: Secondary | ICD-10-CM | POA: Insufficient documentation

## 2015-02-26 DIAGNOSIS — I10 Essential (primary) hypertension: Secondary | ICD-10-CM | POA: Insufficient documentation

## 2015-02-26 DIAGNOSIS — Z9114 Patient's other noncompliance with medication regimen: Secondary | ICD-10-CM | POA: Insufficient documentation

## 2015-02-26 DIAGNOSIS — Z87891 Personal history of nicotine dependence: Secondary | ICD-10-CM | POA: Insufficient documentation

## 2015-02-26 LAB — CBC
HCT: 44.9 % (ref 36.0–46.0)
HEMOGLOBIN: 14.3 g/dL (ref 12.0–15.0)
MCH: 29.4 pg (ref 26.0–34.0)
MCHC: 31.8 g/dL (ref 30.0–36.0)
MCV: 92.4 fL (ref 78.0–100.0)
PLATELETS: 172 10*3/uL (ref 150–400)
RBC: 4.86 MIL/uL (ref 3.87–5.11)
RDW: 14.1 % (ref 11.5–15.5)
WBC: 6.9 10*3/uL (ref 4.0–10.5)

## 2015-02-26 LAB — BASIC METABOLIC PANEL
ANION GAP: 6 (ref 5–15)
BUN: 18 mg/dL (ref 6–20)
CALCIUM: 9.7 mg/dL (ref 8.9–10.3)
CO2: 32 mmol/L (ref 22–32)
CREATININE: 0.99 mg/dL (ref 0.44–1.00)
Chloride: 102 mmol/L (ref 101–111)
GFR calc non Af Amer: 53 mL/min — ABNORMAL LOW (ref >60)
Glucose, Bld: 112 mg/dL — ABNORMAL HIGH (ref 65–99)
Potassium: 4.2 mmol/L (ref 3.5–5.1)
SODIUM: 140 mmol/L (ref 135–145)

## 2015-02-26 LAB — BRAIN NATRIURETIC PEPTIDE: B NATRIURETIC PEPTIDE 5: 163.2 pg/mL — AB (ref 0.0–100.0)

## 2015-02-26 MED ORDER — OLMESARTAN MEDOXOMIL-HCTZ 40-12.5 MG PO TABS: 1.0000 | ORAL_TABLET | Freq: Every day | ORAL | Status: DC

## 2015-02-26 MED ORDER — ALBUTEROL SULFATE (2.5 MG/3ML) 0.083% IN NEBU
3.0000 mL | INHALATION_SOLUTION | RESPIRATORY_TRACT | Status: DC | PRN
  Administered 2015-02-28: 3 mL via RESPIRATORY_TRACT
  Filled 2015-02-26: qty 3

## 2015-02-26 MED ORDER — HYDROCHLOROTHIAZIDE 12.5 MG PO CAPS: 12.5000 mg | ORAL_CAPSULE | Freq: Every day | ORAL | Status: DC

## 2015-02-26 MED ORDER — IOHEXOL 350 MG/ML SOLN
80.0000 mL | Freq: Once | INTRAVENOUS | Status: AC | PRN
  Administered 2015-02-26: 80 mL via INTRAVENOUS

## 2015-02-26 MED ORDER — HEPARIN SODIUM (PORCINE) 5000 UNIT/ML IJ SOLN
5000.0000 [IU] | Freq: Three times a day (TID) | INTRAMUSCULAR | Status: DC
  Administered 2015-02-27 – 2015-02-28 (×4): 5000 [IU] via SUBCUTANEOUS
  Filled 2015-02-26 (×5): qty 1

## 2015-02-26 MED ORDER — HYDROCHLOROTHIAZIDE 12.5 MG PO CAPS
12.5000 mg | ORAL_CAPSULE | Freq: Every day | ORAL | Status: DC
Start: 2015-02-26 — End: 2015-02-28
  Administered 2015-02-26 – 2015-02-27 (×2): 12.5 mg via ORAL
  Filled 2015-02-26 (×2): qty 1

## 2015-02-26 MED ORDER — HYDRALAZINE HCL 20 MG/ML IJ SOLN
2.0000 mg | INTRAMUSCULAR | Status: DC | PRN
  Filled 2015-02-26: qty 1

## 2015-02-26 MED ORDER — ASPIRIN EC 81 MG PO TBEC
81.0000 mg | DELAYED_RELEASE_TABLET | Freq: Every day | ORAL | Status: DC
  Filled 2015-02-26: qty 1

## 2015-02-26 MED ORDER — ONDANSETRON 4 MG PO TBDP
ORAL_TABLET | ORAL | Status: AC
Start: 2015-02-26 — End: 2015-02-27
  Filled 2015-02-26: qty 1

## 2015-02-26 NOTE — ED Notes (Signed)
The pt is hyperventilaring

## 2015-02-26 NOTE — ED Notes (Signed)
The pt is c/o being sob for 3-4 days  No pain anywhere.  She denies all pmh  But she was a smoker.  She appears  To be very anxious

## 2015-02-26 NOTE — ED Provider Notes (Addendum)
CSN: [id number]     Arrival date & time 02/26/15  1608 History   First MD Initiated Contact with Patient 02/26/15 1717     Chief Complaint  Patient presents with  . Shortness of Breath     (Consider location/radiation/quality/duration/timing/severity/associated sxs/prior Treatment) HPI   Patient is a 81 year old female who has not seen a doctor in a "very long time" presenting today with shortness of breath. Patient works at [employer] and has been getting increasing shortness of breath at work. Patient has noticed that her blood pressures been elevated but has not contacted a physician about it. She's a previousheavy smoker. No fevers no increased sputum.     Past Medical History  Diagnosis Date  . Hypertension   . Seasonal allergies    Past Surgical History  Procedure Laterality Date  . Cataract extraction Left 02/2008  . Total abdominal hysterectomy  1975  . Appendectomy  1953   Family History  Problem Relation Age of Onset  . Dementia Mother   . Emphysema Father    Social History  Substance Use Topics  . Smoking status: Former Smoker    Types: Cigarettes    Quit date: 04/15/2014  . Smokeless tobacco: None  . Alcohol Use: Yes   OB History    No data available     Review of Systems  Constitutional: Negative for activity change and fatigue.  HENT: Negative for congestion.   Eyes: Negative for discharge.  Respiratory: Positive for chest tightness and shortness of breath. Negative for cough.   Cardiovascular: Positive for leg swelling. Negative for chest pain.  Gastrointestinal: Negative for abdominal distention.  Genitourinary: Negative for dysuria and difficulty urinating.  Musculoskeletal: Negative for joint swelling.  Skin: Negative for rash.  Allergic/Immunologic: Negative for immunocompromised state.  Psychiatric/Behavioral: Negative for behavioral problems and agitation.      Allergies  Penicillins  Home Medications   Prior to Admission medications    Medication Sig Start Date End Date Taking? Authorizing Provider  olmesartan-hydrochlorothiazide (BENICAR HCT) 40-12.5 MG per tablet Take 1 tablet by mouth daily. 07/17/14   Daleen Bo, MD  predniSONE (DELTASONE) 10 MG tablet Take q day 6,5,4,3,2,1 07/17/14   Daleen Bo, MD   BP 192/94 mmHg  Pulse 92  Temp(Src) 98.8 F (37.1 C) (Oral)  Resp 18  Ht _0  (1.6 m)  Wt 102 lb (46.267 kg)  BMI 18.07 kg/m2  SpO2 94% Physical Exam  Constitutional: She is oriented to person, place, and time. She appears well-developed and well-nourished.  tachynpic at rest, cachectic  HENT:  Head: Normocephalic and atraumatic.  Eyes: Conjunctivae are normal. Right eye exhibits no discharge.  Neck: Neck supple.  Cardiovascular: Normal rate, regular rhythm and normal heart sounds.   No murmur heard. Pulmonary/Chest: Effort normal and breath sounds normal. She has no wheezes. She has no rales.  Clear lungs bilaterally.  Abdominal: Soft. She exhibits no distension. There is no tenderness.  Musculoskeletal: Normal range of motion. She exhibits edema.  +2 pitting edema bilateral lower examination  Neurological: She is oriented to person, place, and time. No cranial nerve deficit.  Skin: Skin is warm and dry. No rash noted. She is not diaphoretic.  Psychiatric: She has a normal mood and affect. Her behavior is normal.  Nursing note and vitals reviewed.   ED Course  Procedures (including critical care time) Labs Review Labs Reviewed  BASIC METABOLIC PANEL - Abnormal; Notable for the following:    Glucose, Bld 112 (*)    GFR  calc non Af Amer 53 (*)    All other components within normal limits  BRAIN NATRIURETIC PEPTIDE - Abnormal; Notable for the following:    B Natriuretic Peptide 163.2 (*)    All other components within normal limits  CBC  LIPID PANEL  HEMOGLOBIN A1C    Imaging Review Dg Chest 2 View  02/26/2015   CLINICAL DATA:  Dyspnea for the past 2 weeks, hypertension, discontinue smoking  eleven months ago.  EXAM: CHEST  2 VIEW  COMPARISON:  PA and lateral chest x-ray of July 16, 2014  FINDINGS: The lungs are hyperinflated with hemidiaphragm flattening. The heart and pulmonary vascularity are normal. The mediastinum is normal in width. There is no pleural effusion. There is tortuosity of the descending thoracic aorta. The bony thorax exhibits no acute abnormality.  IMPRESSION: COPD. There is no evidence of pneumonia, CHF, nor other acute cardiopulmonary abnormality.   Electronically Signed   By: David  Martinique M.D.   On: 02/26/2015 17:10   Ct Angio Chest Pe W/cm &/or Wo Cm  02/26/2015   CLINICAL DATA:  Shortness of breath and chest tightness  EXAM: CT ANGIOGRAPHY CHEST WITH CONTRAST  TECHNIQUE: Multidetector CT imaging of the chest was performed using the standard protocol during bolus administration of intravenous contrast. Multiplanar CT image reconstructions and MIPs were obtained to evaluate the vascular anatomy.  CONTRAST:  64m OMNIPAQUE IOHEXOL 350 MG/ML SOLN  COMPARISON:  Chest radiograph February 26, 2015 ; chest CT angiogram December 05, 2009  FINDINGS: There is no demonstrable pulmonary embolus. There is extensive atherosclerotic change throughout the descending aorta. Areas of ulceration along the [place] of the descending aorta are somewhat better seen on the prior CT examination due to contrast bolus timing. Several areas of ulceration in the descending aorta are present currently. There is an area of localized saccular dilatation along the posterior aspect of the mid descending aorta. This localized area of dilatation measures 1.1 x 0.7 cm and is best seen on sagittal slices 83 through 89, series 9. There is no demonstrable dissection. There is no appreciable ascending thoracic aortic aneurysm. There is only modest atherosclerotic change in the visualized great vessels.  There is underlying centrilobular emphysematous change. There is stable scarring in the left base. There is a stable  8 x 8 mm nodular opacity abutting the pleura in the posterior segment of the left lower lobe. No new parenchymal lung opacity is identified.  Thyroid appears normal. There is no appreciable thoracic adenopathy. The pericardium is not thickened. There is left ventricular hypertrophy. There are scattered foci of coronary artery calcification.  In the visualized upper abdomen, there is dilatation of the proximal aorta with a maximum transverse diameter 3.5 x 3.2 cm. Visualized upper abdominal structures otherwise appear unremarkable. There are no blastic or lytic bone lesions.  Review of the MIP images confirms the above findings.  IMPRESSION: No demonstrable pulmonary embolus.  Extensive atherosclerotic change throughout the descending thoracic aorta with areas of shaggy appearing plaque and areas suggestive of ulceration, somewhat better seen on prior study due to differences in bolus timing with respect to the aorta. A small area of localized dilatation along the posterior descending thoracic aorta is noted which appears to contain thrombus.  Underlying emphysema. Scarring posterior left base with stable 8 x 8 mm nodular opacity in this area.  No adenopathy.  Mild aneurysmal dilatation of the proximal abdominal aorta with a maximum transverse diameter of 3.5 x 3.2 cm.   Electronically Signed  By: Lowella Grip III M.D.   On: 02/26/2015 19:16   I have personally reviewed and evaluated these images and lab results as part of my medical decision-making.  Date/Time:  Friday February 26 2015 16:15:47 EDT Ventricular Rate:  65 PR Interval:  148 QRS Duration: 74 QT Interval:  416 QTC Calculation: 432 R Axis:   98 Suspect arm lead reversal, interpretation assumes no reversal Normal sinus rhythm Biatrial enlargement Rightward axis Pulmonary disease pattern Left ventricular hypertrophy with repolarization abnormality Abnormal ECG No significant change since last tracing Confirmed by Gerald Leitz ([id number])  on 02/26/2015 11:47:14 PM  EKG Interpretation  Date/Time:    Ventricular Rate:    PR Interval:    QRS Duration:   QT Interval:    QTC Calculation:   R Axis:     Text Interpretation:         MDM   Final diagnoses:  None   patient is a 81 year old female percent in the shortness of breath. Patient has not seen a doctor recently. Patient's been becoming increasingly short of breath at work. She is now unable to work for longer than one hour without getting short of breath. Her son-in-law find finally made her come in today.  Patient has clear lungs but. We'll get CT PE given her tachypnea without explanation.    . I think the patient requires an echo. Concerned about patient's cardiac function given her edema and  shortness of breath.  It is possible that we could have patient follow up with a primary care physician to have this done if she is able to ambulate.  On ambulation, she dropped to 80%!  I think she needs an echo, pulm consult, likely needs to be on home O2.   Courteney Julio Alm, MD 02/26/15 2346  Courteney Julio Alm, MD 02/26/15 6213

## 2015-02-26 NOTE — H&P (Signed)
Date: 02/26/2015               Patient Name:  Jasmine Horton MRN: [id number]  DOB: September 19, 1933 Age / Sex: 81 y.o., female   PCP: Lona Kettle, MD         Medical Service: Internal Medicine Teaching Service         Attending Physician: Dr. Annia Belt, MD    First Contact: Dr. Maryellen Pile Pager: [phone]  Second Contact: Dr. Osa Craver Pager: [phone]       After Hours (After 5p/  First Contact Pager: [phone]  weekends / holidays): Second Contact Pager: [phone]   Chief Complaint: Dyspnea on Exertion  History of Present Illness:   Mrs. Waidelich is a 81 year old woman with a past medical history of COPD, HTN, and former tobacco abuse who presents with worsening dyspnea on exertion over the past 5-6 months. She works at [employer], and prior to her worsening symptoms, she was able to walk to the store from the parking lot and work a full day on her feet without taking a break. About 5-6 months ago, she first noticed she felt short of breath but could "work through it." She said others noted she "looked winded." Now, in the past month, she can barely walk across the room without "losing her breath." After she becomes dyspneic, the only thing that improves her symptoms is resting or lying down. She does not take any medications for her dyspnea, or any medications whatsoever.    At times, she says she feels a "tickle" in her throat associated with a dry cough that she attributes to her allergies. She has an occasional runny nose due to allergies. She denies any fever, chills, night sweats, or sick contacts. She denies any recent unintended weight loss, although she said she used to weight 130 lbs in 2000 and lost about 30 pounds that she never regained after the death of her husband. The only leg swelling she notices is ankle swelling after a long day of work, she denies calf pain or tenderness. She denies orthopnea. She denies chest pain. She denies any nausea, vomiting, or diarrhea. She  denies dysuria or polyuria. She denies headache, loss of consciousness, or new weakness.  She had been recently seen in the ED (February 2016) for a COPD exacerbation with acute bronchitis. She was discharged from the ED with prednisone, BP meds, and her inhaler. She has not been using any of these. She has previous hospitalization in 2011 due exertional dyspnea as well. Previous workup includes a 2011 CTA which demonstrated ulcerating plaques in the thoracic aorta. A 2011 TTE showed an normal EF with pulmonary HTN (46 mmHg). A cardiac catheterization in 2012 was normal. She was previously followed by Dr. Lona Kettle at [facility], but has not followed with him for a while and has not been taking any BP medications. She has never regularly followed with a pulmonologist or cardiologist.  In the ED, she was hypertensive to 211/120, but she was afebrile with remaining stable. She was satting nearly 100% on room air at rest, but dropped to 80% while walking. A portable CXR showed hyperinflated lungs hemidiaphragm flattening, consistent with emphysema. A CTA was obtained in the ED to query a PE, but this only demonstrated previously noted atherosclerotic in the descending thoracic aorta, with what appeared to be a thrombus.   Social History: Lives at home with her son. Former heavy smoker. Non-drinker.  Meds: Current Facility-Administered Medications  Medication Dose Route Frequency Provider Last Rate Last Dose  . albuterol (PROVENTIL HFA;VENTOLIN HFA) 108 (90 BASE) MCG/ACT inhaler 2 puff  2 puff Inhalation Q4H PRN Norman Herrlich, MD      . heparin injection 5,000 Units  5,000 Units Subcutaneous 3 times per day Norman Herrlich, MD      . hydrALAZINE (APRESOLINE) injection 2 mg  2 mg Intravenous PRN Norman Herrlich, MD      . Derrill Memo ON 02/27/2015] hydrochlorothiazide (MICROZIDE) capsule 12.5 mg  12.5 mg Oral Daily Norman Herrlich, MD      . ondansetron (ZOFRAN-ODT) 4 MG disintegrating tablet              Allergies: Allergies as of 02/26/2015 - Review Complete 02/26/2015  Allergen Reaction Noted  . Penicillins Other (See Comments) 07/04/2014   Past Medical History  Diagnosis Date  . Hypertension   . Seasonal allergies    Past Surgical History  Procedure Laterality Date  . Cataract extraction Left 02/2008  . Total abdominal hysterectomy  1975  . Appendectomy  1953   Family History  Problem Relation Age of Onset  . Dementia Mother   . Emphysema Father    Social History   Social History  . Marital Status: Widowed    Spouse Name: N/A  . Number of Children: N/A  . Years of Education: N/A   Occupational History  . Not on file.   Social History Main Topics  . Smoking status: Former Smoker    Types: Cigarettes    Quit date: 04/15/2014  . Smokeless tobacco: Not on file  . Alcohol Use: Yes  . Drug Use: No  . Sexual Activity: Not on file   Other Topics Concern  . Not on file   Social History Narrative    Review of Systems: Negative except per HPI above.  Physical Exam: Blood pressure 211/120, pulse 92, temperature 98.8 F (37.1 C), temperature source Oral, resp. rate 18, height _0  (1.6 m), weight 102 lb (46.267 kg), SpO2 94 %. General:  Thin-appearing woman, sitting on edge of bed, in no acute distress HEENT:  Moist mucous membranes. No tonsillar erythema or exudates. No cervical adenopathy or thyromegaly. No JVD, no carotid bruits heard.  Cardiovascular: Regular rate and rhythm. no murmurs, rubs, or gallops. Pulmonary: Normal respiratory effort. Clear to ausculation bilaterally. No wheezes, crackles, or rhonchi. Abdomen: Normal bowel sounds. Soft, nontender, nondistended. Extremities: 1+ edema bilaterally to the ankles. No clubbing or cyanosis. Neurological: AAOx4. 5/5 strength in all extremities. Tongue midline, face symmetric. EOMI. PERRL.  Lab results: Basic Metabolic Panel:  Recent Labs  02/26/15 1625  NA 140  K 4.2  CL 102  CO2 32  GLUCOSE  112*  BUN 18  CREATININE 0.99  CALCIUM 9.7   CBC:  Recent Labs  02/26/15 1625  WBC 6.9  HGB 14.3  HCT 44.9  MCV 92.4  PLT 172     Imaging results:  Dg Chest 2 View  02/26/2015   CLINICAL DATA:  Dyspnea for the past 2 weeks, hypertension, discontinue smoking eleven months ago.  EXAM: CHEST  2 VIEW  COMPARISON:  PA and lateral chest x-ray of July 16, 2014  FINDINGS: The lungs are hyperinflated with hemidiaphragm flattening. The heart and pulmonary vascularity are normal. The mediastinum is normal in width. There is no pleural effusion. There is tortuosity of the descending thoracic aorta. The bony thorax exhibits no acute abnormality.  IMPRESSION: COPD. There is no evidence of pneumonia,  CHF, nor other acute cardiopulmonary abnormality.   Electronically Signed   By: David  Martinique M.D.   On: 02/26/2015 17:10   Ct Angio Chest Pe W/cm &/or Wo Cm  02/26/2015   CLINICAL DATA:  Shortness of breath and chest tightness  EXAM: CT ANGIOGRAPHY CHEST WITH CONTRAST  TECHNIQUE: Multidetector CT imaging of the chest was performed using the standard protocol during bolus administration of intravenous contrast. Multiplanar CT image reconstructions and MIPs were obtained to evaluate the vascular anatomy.  CONTRAST:  65m OMNIPAQUE IOHEXOL 350 MG/ML SOLN  COMPARISON:  Chest radiograph February 26, 2015 ; chest CT angiogram December 05, 2009  FINDINGS: There is no demonstrable pulmonary embolus. There is extensive atherosclerotic change throughout the descending aorta. Areas of ulceration along the [place] of the descending aorta are somewhat better seen on the prior CT examination due to contrast bolus timing. Several areas of ulceration in the descending aorta are present currently. There is an area of localized saccular dilatation along the posterior aspect of the mid descending aorta. This localized area of dilatation measures 1.1 x 0.7 cm and is best seen on sagittal slices 83 through 89, series 9. There is no  demonstrable dissection. There is no appreciable ascending thoracic aortic aneurysm. There is only modest atherosclerotic change in the visualized great vessels.  There is underlying centrilobular emphysematous change. There is stable scarring in the left base. There is a stable 8 x 8 mm nodular opacity abutting the pleura in the posterior segment of the left lower lobe. No new parenchymal lung opacity is identified.  Thyroid appears normal. There is no appreciable thoracic adenopathy. The pericardium is not thickened. There is left ventricular hypertrophy. There are scattered foci of coronary artery calcification.  In the visualized upper abdomen, there is dilatation of the proximal aorta with a maximum transverse diameter 3.5 x 3.2 cm. Visualized upper abdominal structures otherwise appear unremarkable. There are no blastic or lytic bone lesions.  Review of the MIP images confirms the above findings.  IMPRESSION: No demonstrable pulmonary embolus.  Extensive atherosclerotic change throughout the descending thoracic aorta with areas of shaggy appearing plaque and areas suggestive of ulceration, somewhat better seen on prior study due to differences in bolus timing with respect to the aorta. A small area of localized dilatation along the posterior descending thoracic aorta is noted which appears to contain thrombus.  Underlying emphysema. Scarring posterior left base with stable 8 x 8 mm nodular opacity in this area.  No adenopathy.  Mild aneurysmal dilatation of the proximal abdominal aorta with a maximum transverse diameter of 3.5 x 3.2 cm.   Electronically Signed   By: WLowella GripIII M.D.   On: 02/26/2015 19:16    Assessment & Plan by Problem: Active Problems:   Hypoxia  Dyspnea on Exertion: Differential includes worsening emphysema, COPD exacerbation, CHF exacerbation, atypical pneumonia. The most likely culprit is worsening emphysema due to steady decline, lack of wheezing or signs of infection,  and lack of signs for heart failure. Nonetheless, previously noted pulmonary HTN on echo could have been early manifestations of cor pulmonale and warrants further investigation. Her quality of life will likely significantly improve if she has access to home O2, maintenance inhalers, and prn inhalers.  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 However, she reports that she cannot tolerate it due to GI upset. Per CV/GI ASA risk calculator, she is low risk for CV case and ASA is not recommended (10 yr cardiovascular risk 6.1%). Previously on a statin but taken off b/c of weight loss and reduction in cholesterol. - Consider consult VVS. - Checking lipid panel hemoglobin A1c  DVT Prophylaxis:  Heparin [place]  Dispo: Disposition is deferred at this time, awaiting improvement of current medical problems. Anticipated discharge in approximately 1-2 day(s).   The patient does have a current PCP Lona Kettle, MD) and does need an [facility] hospital follow-up appointment after discharge.  The patient does have transportation limitations that hinder transportation to clinic appointments.  Signed: Liberty Handy, MD 02/26/2015, 10:13 PM

## 2015-02-26 NOTE — Progress Notes (Signed)
Pt arrived to unit. BP elevated, MD notified. Otherwise VSS. No complaints of pain. Pt oriented to room. Call bell within reach. Will continue to monitor closely.  Raliegh Ip RN

## 2015-02-26 NOTE — ED Notes (Signed)
sats 97- 100 on ra

## 2015-02-26 NOTE — Progress Notes (Signed)
MD paged regarding pts high BP of 192/94. New orders given. Will continue to monitor closely.  Raliegh Ip RN

## 2015-02-27 ENCOUNTER — Other Ambulatory Visit (HOSPITAL_COMMUNITY): Payer: Medicare Other

## 2015-02-27 DIAGNOSIS — R0902 Hypoxemia: Secondary | ICD-10-CM

## 2015-02-27 DIAGNOSIS — I10 Essential (primary) hypertension: Secondary | ICD-10-CM | POA: Diagnosis not present

## 2015-02-27 DIAGNOSIS — J449 Chronic obstructive pulmonary disease, unspecified: Secondary | ICD-10-CM

## 2015-02-27 DIAGNOSIS — I272 Other secondary pulmonary hypertension: Secondary | ICD-10-CM

## 2015-02-27 DIAGNOSIS — I2723 Pulmonary hypertension due to lung diseases and hypoxia: Secondary | ICD-10-CM | POA: Insufficient documentation

## 2015-02-27 DIAGNOSIS — J431 Panlobular emphysema: Secondary | ICD-10-CM | POA: Diagnosis not present

## 2015-02-27 LAB — LIPID PANEL
CHOL/HDL RATIO: 3.4 ratio
CHOLESTEROL: 239 mg/dL — AB (ref 0–200)
HDL: 70 mg/dL (ref >40)
LDL Cholesterol: 145 mg/dL — ABNORMAL HIGH (ref 0–99)
TRIGLYCERIDES: 118 mg/dL (ref <150)
VLDL: 24 mg/dL (ref 0–40)

## 2015-02-27 MED ORDER — TIOTROPIUM BROMIDE MONOHYDRATE 18 MCG IN CAPS
18.0000 ug | ORAL_CAPSULE | RESPIRATORY_TRACT | Status: DC
  Filled 2015-02-27 (×2): qty 5

## 2015-02-27 NOTE — Progress Notes (Signed)
Pt BP was 174/102.  Rechecked BP and was 176/85. Pt is asymptomatic and BP has been steady running high.   Paged/addressed with Doctor and stated that He will make orders when the the BP gets above 180.  Oswald Hillock, RN

## 2015-02-27 NOTE — Progress Notes (Signed)
   Subjective: Jasmine Horton reports she is doing well this morning. Still having SOB with exertion but no problems at rest. Denies any cough or sputum production. No fevers.   Objective: Vital signs in last 24 hours: Filed Vitals:   02/26/15 2209 02/26/15 2243 02/27/15 0000 02/27/15 0540  BP: 211/120 192/94 167/82 165/87  Pulse: 92   71  Temp: 98.8 F (37.1 C)   97.8 F (36.6 C)  TempSrc: Oral   Oral  Resp: 18   18  Height:      Weight:      SpO2: 94%   98%   Weight change:  No intake or output data in the 24 hours ending 02/27/15 1304   PHYSICAL EXAM General: Thin-appearing woman, sitting in bed, in no acute distress HEENT:benign Cardiovascular: Regular rate and rhythm. no murmurs, rubs, or gallops. Pulmonary: Normal respiratory effort. Clear to ausculation bilaterally. No wheezes, crackles, or rhonchi. Abdomen: Normal bowel sounds. Soft, nontender, nondistended. Extremities: trace pedal edema Neurological: AAOx4.  Medications: I have reviewed the patient's current medications. Scheduled Meds: . heparin  5,000 Units Subcutaneous 3 times per day  . hydrochlorothiazide  12.5 mg Oral Daily   Continuous Infusions:  PRN Meds:.albuterol Assessment/Plan: Active Problems:   Hypoxia  Dyspnea on Exertion: Likely long standing worsening untreated COPD 2/2 non compliance. She denies any fevers, chills, cough, sputum production. She is euvolemic on exam. No evidence of PNA on CXR. She is sating well on RA at rest but desatted to 80s ambulating yesterday. PFTs not done on the weekend. Will get a VBG today. Start her on Spiriva. BNP mildly elevated at 163, 151 on previous admission.  - ECHO - Ambulate patient while monitoring pulse oximetry to qualify for home oxygen - CBC and BMET in am - Spiriva - Albuterol prn   HTN: BP improved to 165/87 today. Will continue HCTZ 12.5 today. Increase to 25 mg tomorrow if SBP still >160. She has chronic uncontrolled HTN and do not want to drop her  too low too fast. Will need follow up outpatient with PCP.  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 Will have patient follow up with Cardiology outpatient. Pt states she cannot tolerate ASA due to GI upset.  Per CV/GI ASA risk calculator pt is low risk for CV case and ASA is not recommended  HLD: ASCVD risk 58% in 10 years, however, not in recommended age range and previously on a statin but taken off b/c of weight loss and reduction in cholesterol.  DVT Prophylaxis: Heparin [place]  Dispo: Disposition is deferred at this time, awaiting improvement of current medical problems.  Anticipated discharge in approximately 1-2 day(s).   The patient does have a current PCP Lona Kettle, MD) and does need an [facility] hospital follow-up appointment after discharge.  The patient does not have transportation limitations that hinder transportation to clinic appointments.  .Services Needed at time of discharge: Y = Yes, Blank = No PT:   OT:   RN:   Equipment:   Other:     LOS: 1 day   Maryellen Pile, MD IMTS PGY-1 [phone] 02/27/2015, 1:04 PM

## 2015-02-28 ENCOUNTER — Observation Stay (HOSPITAL_BASED_OUTPATIENT_CLINIC_OR_DEPARTMENT_OTHER): Payer: Medicare Other

## 2015-02-28 DIAGNOSIS — R0600 Dyspnea, unspecified: Secondary | ICD-10-CM

## 2015-02-28 DIAGNOSIS — J431 Panlobular emphysema: Secondary | ICD-10-CM | POA: Diagnosis not present

## 2015-02-28 DIAGNOSIS — I10 Essential (primary) hypertension: Secondary | ICD-10-CM | POA: Diagnosis not present

## 2015-02-28 MED ORDER — TIOTROPIUM BROMIDE MONOHYDRATE 18 MCG IN CAPS: 18.0000 ug | ORAL_CAPSULE | Freq: Every day | RESPIRATORY_TRACT | Status: DC

## 2015-02-28 MED ORDER — HYDROCHLOROTHIAZIDE 25 MG PO TABS
25.0000 mg | ORAL_TABLET | Freq: Every day | ORAL | Status: DC
Start: 2015-02-28 — End: 2015-02-28
  Administered 2015-02-28: 25 mg via ORAL
  Filled 2015-02-28: qty 1

## 2015-02-28 MED ORDER — HYDROCHLOROTHIAZIDE 25 MG PO TABS: 25.0000 mg | ORAL_TABLET | Freq: Every day | ORAL | Status: DC

## 2015-02-28 MED ORDER — TIOTROPIUM BROMIDE MONOHYDRATE 18 MCG IN CAPS
18.0000 ug | ORAL_CAPSULE | Freq: Every day | RESPIRATORY_TRACT | Status: DC
  Administered 2015-02-28: 18 ug via RESPIRATORY_TRACT
  Filled 2015-02-28: qty 5

## 2015-02-28 MED ORDER — ALBUTEROL SULFATE HFA 108 (90 BASE) MCG/ACT IN AERS: 2.0000 | INHALATION_SPRAY | Freq: Four times a day (QID) | RESPIRATORY_TRACT | Status: DC | PRN

## 2015-02-28 NOTE — Plan of Care (Signed)
Problem: Phase I Progression Outcomes Goal: Flu/PneumoVaccines if indicated Outcome: Not Met (add Reason) Patient refused to receive the flu vaccination

## 2015-02-28 NOTE — Progress Notes (Signed)
Room air patient's O2 sat 91. Ambulated in hall on room air patient's O2 sat 91-94%. Returned to room and applied O2 nasal cannula at 2L for patient's comforts.  Wynetta Emery, Oliwia Berzins C 02/28/2015  12:23 PM

## 2015-02-28 NOTE — Care Management Note (Addendum)
Case Management Note  Patient Details  Name: Jasmine Horton MRN: [id number] Date of Birth: 07/16/1933  Subjective/Objective:                   dyspnea  Action/Plan:  Discharge planning Expected Discharge Date:  02/28/15               Expected Discharge Plan:  Home/Self Care  In-House Referral:     Discharge planning Services  CM Consult  Post Acute Care Choice:    Choice offered to:     DME Arranged:    DME Agency:     HH Arranged:    HH Agency:     Status of Service:  Completed, signed off  Medicare Important Message Given:    Date Medicare IM Given:    Medicare IM give by:    Date Additional Medicare IM Given:    Additional Medicare Important Message give by:     If discussed at [place] of Stay Meetings, dates discussed:    Additional Comments: Cm received call from Md requesting arrangement for home O2.  RN performed a pulmonary oxygen trial and pt does not meet criteria for home oxygen.  "Room air patient's O2 sat 91. Ambulated in hall on room air patient's O2 sat 91-94%."  No other Cm needs were communicated. Dellie Catholic, RN 02/28/2015, 3:39 PM

## 2015-02-28 NOTE — Progress Notes (Signed)
Jasmine Horton discharged Home with son per MD order.  Discharge instructions reviewed and discussed with the patient, all questions and concerns answered. Copy of instructions and care notes for new medications & diagnosis  given to patient.  Paged MD to see when pt. Could return to work, as well as a [employer] for work.  Dr. Marvel Plan call back and note was given to pt. On when to return to work.    Medication List    STOP taking these medications        olmesartan-hydrochlorothiazide 40-12.5 MG tablet  Commonly known as:  BENICAR HCT     predniSONE 10 MG tablet  Commonly known as:  DELTASONE      TAKE these medications        albuterol 108 (90 BASE) MCG/ACT inhaler  Commonly known as:  PROVENTIL HFA;VENTOLIN HFA  Inhale 2 puffs into the lungs every 6 (six) hours as needed for wheezing or shortness of breath.     hydrochlorothiazide 25 MG tablet  Commonly known as:  HYDRODIURIL  Take 1 tablet (25 mg total) by mouth daily.     tiotropium 18 MCG inhalation capsule  Commonly known as:  SPIRIVA  Place 1 capsule (18 mcg total) into inhaler and inhale daily.        Patients skin is clean, dry and intact, no evidence of skin break down. IV site discontinued and catheter remains intact. Site without signs and symptoms of complications. Dressing and pressure applied.  Patient escorted to car by NT in a wheelchair,  no distress noted upon discharge.  Jasmine Horton, Jasmine Horton 02/28/2015 5:27 PM

## 2015-02-28 NOTE — Progress Notes (Signed)
  Echocardiogram 2D Echocardiogram has been performed.  Jennette Dubin 02/28/2015, 11:07 AM

## 2015-02-28 NOTE — Discharge Summary (Signed)
Name: Jasmine Horton MRN: [id number] DOB: May 21, 1934 81 y.o. PCP: Lona Kettle, MD  Date of Admission: 02/26/2015  5:04 PM Date of Discharge: 03/01/2015 Attending Physician: No att. providers found  Discharge Diagnosis: COPD  Active Problems:   Hypoxia   Essential hypertension   Panlobular emphysema ([place])   Pulmonary hypertension due to chronic obstructive pulmonary disease ([place])  Discharge Medications:   Medication List    STOP taking these medications        olmesartan-hydrochlorothiazide 40-12.5 MG tablet  Commonly known as:  BENICAR HCT     predniSONE 10 MG tablet  Commonly known as:  DELTASONE      TAKE these medications        albuterol 108 (90 BASE) MCG/ACT inhaler  Commonly known as:  PROVENTIL HFA;VENTOLIN HFA  Inhale 2 puffs into the lungs every 6 (six) hours as needed for wheezing or shortness of breath.     hydrochlorothiazide 25 MG tablet  Commonly known as:  HYDRODIURIL  Take 1 tablet (25 mg total) by mouth daily.     tiotropium 18 MCG inhalation capsule  Commonly known as:  SPIRIVA  Place 1 capsule (18 mcg total) into inhaler and inhale daily.        Disposition and follow-up:   Jasmine Horton was discharged from [facility] in Good condition.  At the hospital follow up visit please address:  1.  DOE 2/2 Emphysema: Improved with albuterol and Sprivia. Patient did not qualify for home O2. Please assess improvement on these medications and consider PFT testing.   2.  Labs / imaging needed at time of follow-up: PFTs  3.  Pending labs/ test needing follow-up: None  Follow-up Appointments: Follow-up Information    Follow up with  Melinda Crutch, MD. Schedule an appointment as soon as possible for a visit in 1 week.   Specialty:  Family Medicine   Contact information:   [place] [address] [phone]       Discharge Instructions: Discharge Instructions    Diet - low sodium heart healthy    Complete  by:  As directed      Increase activity slowly    Complete by:  As directed            Consultations:  None  Procedures Performed:  Dg Chest 2 View  02/26/2015   CLINICAL DATA:  Dyspnea for the past 2 weeks, hypertension, discontinue smoking eleven months ago.  EXAM: CHEST  2 VIEW  COMPARISON:  PA and lateral chest x-ray of July 16, 2014  FINDINGS: The lungs are hyperinflated with hemidiaphragm flattening. The heart and pulmonary vascularity are normal. The mediastinum is normal in width. There is no pleural effusion. There is tortuosity of the descending thoracic aorta. The bony thorax exhibits no acute abnormality.  IMPRESSION: COPD. There is no evidence of pneumonia, CHF, nor other acute cardiopulmonary abnormality.   Electronically Signed   By: David  Martinique M.D.   On: 02/26/2015 17:10   Ct Angio Chest Pe W/cm &/or Wo Cm  02/26/2015   CLINICAL DATA:  Shortness of breath and chest tightness  EXAM: CT ANGIOGRAPHY CHEST WITH CONTRAST  TECHNIQUE: Multidetector CT imaging of the chest was performed using the standard protocol during bolus administration of intravenous contrast. Multiplanar CT image reconstructions and MIPs were obtained to evaluate the vascular anatomy.  CONTRAST:  6m OMNIPAQUE IOHEXOL 350 MG/ML SOLN  COMPARISON:  Chest radiograph February 26, 2015 ; chest CT angiogram  December 05, 2009  FINDINGS: There is no demonstrable pulmonary embolus. There is extensive atherosclerotic change throughout the descending aorta. Areas of ulceration along the [place] of the descending aorta are somewhat better seen on the prior CT examination due to contrast bolus timing. Several areas of ulceration in the descending aorta are present currently. There is an area of localized saccular dilatation along the posterior aspect of the mid descending aorta. This localized area of dilatation measures 1.1 x 0.7 cm and is best seen on sagittal slices 83 through 89, series 9. There is no demonstrable dissection.  There is no appreciable ascending thoracic aortic aneurysm. There is only modest atherosclerotic change in the visualized great vessels.  There is underlying centrilobular emphysematous change. There is stable scarring in the left base. There is a stable 8 x 8 mm nodular opacity abutting the pleura in the posterior segment of the left lower lobe. No new parenchymal lung opacity is identified.  Thyroid appears normal. There is no appreciable thoracic adenopathy. The pericardium is not thickened. There is left ventricular hypertrophy. There are scattered foci of coronary artery calcification.  In the visualized upper abdomen, there is dilatation of the proximal aorta with a maximum transverse diameter 3.5 x 3.2 cm. Visualized upper abdominal structures otherwise appear unremarkable. There are no blastic or lytic bone lesions.  Review of the MIP images confirms the above findings.  IMPRESSION: No demonstrable pulmonary embolus.  Extensive atherosclerotic change throughout the descending thoracic aorta with areas of shaggy appearing plaque and areas suggestive of ulceration, somewhat better seen on prior study due to differences in bolus timing with respect to the aorta. A small area of localized dilatation along the posterior descending thoracic aorta is noted which appears to contain thrombus.  Underlying emphysema. Scarring posterior left base with stable 8 x 8 mm nodular opacity in this area.  No adenopathy.  Mild aneurysmal dilatation of the proximal abdominal aorta with a maximum transverse diameter of 3.5 x 3.2 cm.   Electronically Signed   By: Lowella Grip III M.D.   On: 02/26/2015 19:16    2D Echo:  - Left ventricle: The cavity size was normal. There was moderate concentric hypertrophy. Systolic function was normal. The estimated ejection fraction was in the range of 55% to 60%. Mallozzi motion was normal; there were no regional Feria motion abnormalities. There was an increased relative  contribution of atrial contraction to ventricular filling. Doppler parameters are consistent with abnormal left ventricular relaxation (grade 1 diastolic dysfunction). - Aortic valve: Mildly calcified annulus. Trileaflet; mildly thickened leaflets. - Atrial septum: There was increased thickness of the septum, consistent with lipomatous hypertrophy.  Admission HPI: Mrs. Blackard is a 81 year old woman with a past medical history of COPD, HTN, and former tobacco abuse who presents with worsening dyspnea on exertion over the past 5-6 months. She works at [employer], and prior to her worsening symptoms, she was able to walk to the store from the parking lot and work a full day on her feet without taking a break. About 5-6 months ago, she first noticed she felt short of breath but could "work through it." She said others noted she "looked winded." Now, in the past month, she can barely walk across the room without "losing her breath." After she becomes dyspneic, the only thing that improves her symptoms is resting or lying down. She does not take any medications for her dyspnea, or any medications whatsoever.   At times, she says she feels a "  tickle" in her throat associated with a dry cough that she attributes to her allergies. She has an occasional runny nose due to allergies. She denies any fever, chills, night sweats, or sick contacts. She denies any recent unintended weight loss, although she said she used to weight 130 lbs in 2000 and lost about 30 pounds that she never regained after the death of her husband. The only leg swelling she notices is ankle swelling after a long day of work, she denies calf pain or tenderness. She denies orthopnea. She denies chest pain. She denies any nausea, vomiting, or diarrhea. She denies dysuria or polyuria. She denies headache, loss of consciousness, or new weakness.  She had been recently seen in the ED (February 2016) for a COPD exacerbation with acute bronchitis.  She was discharged from the ED with prednisone, BP meds, and her inhaler. She has not been using any of these. She has previous hospitalization in 2011 due exertional dyspnea as well. Previous workup includes a 2011 CTA which demonstrated ulcerating plaques in the thoracic aorta. A 2011 TTE showed an normal EF with pulmonary HTN (46 mmHg). A cardiac catheterization in 2012 was normal. She was previously followed by Dr. Lona Kettle at [facility], but has not followed with him for a while and has not been taking any BP medications. She has never regularly followed with a pulmonologist or cardiologist.  In the ED, she was hypertensive to 211/120, but she was afebrile with remaining stable. She was satting nearly 100% on room air at rest, but dropped to 80% while walking. A portable CXR showed hyperinflated lungs hemidiaphragm flattening, consistent with emphysema. A CTA was obtained in the ED to query a PE, but this only demonstrated previously noted atherosclerotic in the descending thoracic aorta, with what appeared to be a thrombus.   Social History: Lives at home with her son. Former heavy smoker. Non-drinker.  Hospital Course by problem list: Active Problems:   Hypoxia   Essential hypertension   Panlobular emphysema ([facility])   Pulmonary hypertension due to chronic obstructive pulmonary disease ([facility])   Dyspnea on Exertion: Patient improved on Spiriva and albuterol. Likely long standing worsening untreated COPD 2/2 medication non-compliance and tobacco abuse. Quit tobacco one year ago. She denies any fevers, chills, cough, sputum production. Clinical presentation is consistent with worsening of chronic COPD and is not consistent with CHF exacerbation, pneumonia or URI. Echo completed and showed EF of 83-29%, grade 1 diastolic dysfunction, mild calcified AV, but no stenosis or regurgitation. Patient was ambulated for over 3 minutes and oxygen saturations did not drop below 90%; therefore, patient  does not qualify for home oxygen. We continued to treat her for presumed COPD with short acting beta agonist and long acting bronchodilator. Patient was discharged on Spiriva BID and Albuterol prn. Recommend follow up with PCP for outpatient PFTs.   HTN: BP improved on HCTZ 25 mg daily today. Will need follow up outpatient with PCP. We continued HCTZ 25 mg daily on discharge.  Thrombus in Descending Aorta: Discussed with radiology, not overly impressed. Will have patient follow up with Cardiology outpatient. Pt states she cannot tolerate ASA due to GI upset. Per CV/GI ASA risk calculator pt is low risk for CV case and ASA is not recommended. Patient should follow up with Cardiology as outpatient.  Discharge Vitals:   BP 165/89 mmHg  Pulse 97  Temp(Src) 98.5 F (36.9 C) (Oral)  Resp 18  Ht _0  (1.6 m)  Wt 102 lb (46.267  kg)  BMI 18.07 kg/m2  SpO2 98%  Discharge Labs:  No results found for this or any previous visit (from the past 24 hour(s)).  Signed: Osa Craver, DO PGY-2 Internal Medicine Resident Pager # [phone] 03/01/2015 4:40 PM   Services Ordered on Discharge: None Equipment Ordered on Discharge: None

## 2015-02-28 NOTE — Progress Notes (Signed)
Subjective:  Patient was seen and examined this morning. No acute events overnight. Patient continues to feel short of breath. She denies any fever, chills, or cough. She ambulate well this morning and maintained O2 sats in the 90s.   Objective: Filed Vitals:   02/28/15 0830 02/28/15 0924 02/28/15 0943 02/28/15 1133  BP:  137/89    Pulse:  84    Temp:      TempSrc:      Resp:      Height:      Weight:      SpO2: 94%  98% 98%   General: Vital signs reviewed.  Patient is thin, elderly appearing female, in no acute distress and cooperative with exam.  Cardiovascular: RRR, S1 normal, S2 normal Pulmonary/Chest: Decreased breath sounds. Clear to auscultation bilaterally, no wheezes, rales, or rhonchi. Abdominal: Soft, non-tender, non-distended, BS + Extremities: No lower extremity edema bilaterally, pulses symmetric and intact bilaterally.  Psychiatric: Normal mood and affect. speech and behavior is normal. Cognition and memory are normal.    Weight change:   Intake/Output Summary (Last 24 hours) at 02/28/15 1221 Last data filed at 02/28/15 0900  Gross per 24 hour  Intake    540 ml  Output      0 ml  Net    540 ml   Lab Results: Basic Metabolic Panel:  Recent Labs Lab 02/26/15 1625  NA 140  K 4.2  CL 102  CO2 32  GLUCOSE 112*  BUN 18  CREATININE 0.99  CALCIUM 9.7   CBC:  Recent Labs Lab 02/26/15 1625  WBC 6.9  HGB 14.3  HCT 44.9  MCV 92.4  PLT 172   Fasting Lipid Panel:  Recent Labs Lab 02/27/15 0653  CHOL 239*  HDL 70  LDLCALC 145*  TRIG 118  CHOLHDL 3.4   Studies/Results: Dg Chest 2 View  02/26/2015   CLINICAL DATA:  Dyspnea for the past 2 weeks, hypertension, discontinue smoking eleven months ago.  EXAM: CHEST  2 VIEW  COMPARISON:  PA and lateral chest x-ray of July 16, 2014  FINDINGS: The lungs are hyperinflated with hemidiaphragm flattening. The heart and pulmonary vascularity are normal. The mediastinum is normal in width. There is no  pleural effusion. There is tortuosity of the descending thoracic aorta. The bony thorax exhibits no acute abnormality.  IMPRESSION: COPD. There is no evidence of pneumonia, CHF, nor other acute cardiopulmonary abnormality.   Electronically Signed   By: David  Martinique M.D.   On: 02/26/2015 17:10   Ct Angio Chest Pe W/cm &/or Wo Cm  02/26/2015   CLINICAL DATA:  Shortness of breath and chest tightness  EXAM: CT ANGIOGRAPHY CHEST WITH CONTRAST  TECHNIQUE: Multidetector CT imaging of the chest was performed using the standard protocol during bolus administration of intravenous contrast. Multiplanar CT image reconstructions and MIPs were obtained to evaluate the vascular anatomy.  CONTRAST:  21m OMNIPAQUE IOHEXOL 350 MG/ML SOLN  COMPARISON:  Chest radiograph February 26, 2015 ; chest CT angiogram December 05, 2009  FINDINGS: There is no demonstrable pulmonary embolus. There is extensive atherosclerotic change throughout the descending aorta. Areas of ulceration along the [place] of the descending aorta are somewhat better seen on the prior CT examination due to contrast bolus timing. Several areas of ulceration in the descending aorta are present currently. There is an area of localized saccular dilatation along the posterior aspect of the mid descending aorta. This localized area of dilatation measures 1.1 x 0.7 cm and is best  seen on sagittal slices 83 through 89, series 9. There is no demonstrable dissection. There is no appreciable ascending thoracic aortic aneurysm. There is only modest atherosclerotic change in the visualized great vessels.  There is underlying centrilobular emphysematous change. There is stable scarring in the left base. There is a stable 8 x 8 mm nodular opacity abutting the pleura in the posterior segment of the left lower lobe. No new parenchymal lung opacity is identified.  Thyroid appears normal. There is no appreciable thoracic adenopathy. The pericardium is not thickened. There is left  ventricular hypertrophy. There are scattered foci of coronary artery calcification.  In the visualized upper abdomen, there is dilatation of the proximal aorta with a maximum transverse diameter 3.5 x 3.2 cm. Visualized upper abdominal structures otherwise appear unremarkable. There are no blastic or lytic bone lesions.  Review of the MIP images confirms the above findings.  IMPRESSION: No demonstrable pulmonary embolus.  Extensive atherosclerotic change throughout the descending thoracic aorta with areas of shaggy appearing plaque and areas suggestive of ulceration, somewhat better seen on prior study due to differences in bolus timing with respect to the aorta. A small area of localized dilatation along the posterior descending thoracic aorta is noted which appears to contain thrombus.  Underlying emphysema. Scarring posterior left base with stable 8 x 8 mm nodular opacity in this area.  No adenopathy.  Mild aneurysmal dilatation of the proximal abdominal aorta with a maximum transverse diameter of 3.5 x 3.2 cm.   Electronically Signed   By: Lowella Grip III M.D.   On: 02/26/2015 19:16   Medications:  I have reviewed the patient's current medications. Prior to Admission:  Prescriptions prior to admission  Medication Sig Dispense Refill Last Dose  . olmesartan-hydrochlorothiazide (BENICAR HCT) 40-12.5 MG per tablet Take 1 tablet by mouth daily. 30 tablet 3   . predniSONE (DELTASONE) 10 MG tablet Take q day 6,5,4,3,2,1 21 tablet 0    Scheduled Meds: . heparin  5,000 Units Subcutaneous 3 times per day  . hydrochlorothiazide  25 mg Oral Daily  . tiotropium  18 mcg Inhalation Daily   Continuous Infusions:  PRN Meds:.albuterol Assessment/Plan: Active Problems:   Hypoxia   Essential hypertension   Panlobular emphysema (H[facility])   Pulmonary hypertension due to chronic obstructive pulmonary disease ([facility])  Dyspnea on Exertion: Likely long standing worsening untreated COPD 2/2 medication  non-compliance and tobacco abuse. Quit tobacco one year ago. She denies any fevers, chills, cough, sputum production. Clinical presentation is consistent with worsening of chronic COPD and is not consistent with CHF exacerbation, pneumonia or URI. PFTs not done on the weekend, but could be done as outpatient if discharge today. Echo completed and showed EF of 98-26%, grade 1 diastolic dysfunction, mild calcified AV, but no stenosis or regurgitation. Patient was ambulated for over 3 minutes and oxygen saturations did not drop below 90%; therefore, patient does not qualify for home oxygen. We will continue to treat her for presumed COPD with short acting beta agonist and long acting bronchodilator.  -Spiriva BID -Albuterol prn   HTN: BP improved from 16/79 to 137/79 today on HCTZ 25 mg daily today. Will need follow up outpatient with PCP.  -HCTZ 25 mg daily  Thrombus in Descending Aorta: Discussed with radiology, not overly impressed. Will have patient follow up with Cardiology outpatient. Pt states she cannot tolerate ASA due to GI upset. Per CV/GI ASA risk calculator pt is low risk for CV case and ASA is not recommended. -Follow up  with Cardiology as outpatient  DVT Prophylaxis: Heparin [place] QD  Dispo: Anticipated discharge today or tomorrow.   The patient does have a current PCP Lona Kettle, MD) and does not need an [facility] hospital follow-up appointment after discharge.  The patient does not have transportation limitations that hinder transportation to clinic appointments.  .Services Needed at time of discharge: Y = Yes, Blank = No PT:   OT:   RN:   Equipment:   Other:     LOS: 2 days   Osa Craver, DO PGY-1 Internal Medicine Resident Pager # [phone] 02/28/2015 12:21 PM

## 2015-02-28 NOTE — Discharge Instructions (Signed)
Please use your albuterol and Spirva inhaler every day for your lung disease.  You will need to follow up with your primary care doctor and have outpatient pulmonary function tests completed.  Please take hydrochlorothiazide 25 mg a day for your blood pressure.  Chronic Obstructive Pulmonary Disease Chronic obstructive pulmonary disease (COPD) is a common lung condition in which airflow from the lungs is limited. COPD is a general term that can be used to describe many different lung problems that limit airflow, including both chronic bronchitis and emphysema. If you have COPD, your lung function will probably never return to normal, but there are measures you can take to improve lung function and make yourself feel better.  CAUSES   Smoking (common).   Exposure to secondhand smoke.   Genetic problems.  Chronic inflammatory lung diseases or recurrent infections. SYMPTOMS   Shortness of breath, especially with physical activity.   Deep, persistent (chronic) cough with a large amount of thick mucus.   Wheezing.   Rapid breaths (tachypnea).   Gray or bluish discoloration (cyanosis) of the skin, especially in fingers, toes, or lips.   Fatigue.   Weight loss.   Frequent infections or episodes when breathing symptoms become much worse (exacerbations).   Chest tightness. DIAGNOSIS  Your health care provider will take a medical history and perform a physical examination to make the initial diagnosis. Additional tests for COPD may include:   Lung (pulmonary) function tests.  Chest X-ray.  CT scan.  Blood tests. TREATMENT  Treatment available to help you feel better when you have COPD includes:   Inhaler and nebulizer medicines. These help manage the symptoms of COPD and make your breathing more comfortable.  Supplemental oxygen. Supplemental oxygen is only helpful if you have a low oxygen level in your blood.   Exercise and physical activity. These are  beneficial for nearly all people with COPD. Some people may also benefit from a pulmonary rehabilitation program. HOME CARE INSTRUCTIONS   Take all medicines (inhaled or pills) as directed by your health care provider.  Avoid over-the-counter medicines or cough syrups that dry up your airway (such as antihistamines) and slow down the elimination of secretions unless instructed otherwise by your health care provider.   If you are a smoker, the most important thing that you can do is stop smoking. Continuing to smoke will cause further lung damage and breathing trouble. Ask your health care provider for help with quitting smoking. He or she can direct you to community resources or hospitals that provide support.  Avoid exposure to irritants such as smoke, chemicals, and fumes that aggravate your breathing.  Use oxygen therapy and pulmonary rehabilitation if directed by your health care provider. If you require home oxygen therapy, ask your health care provider whether you should purchase a pulse oximeter to measure your oxygen level at home.   Avoid contact with individuals who have a contagious illness.  Avoid extreme temperature and humidity changes.  Eat healthy foods. Eating smaller, more frequent meals and resting before meals may help you maintain your strength.  Stay active, but balance activity with periods of rest. Exercise and physical activity will help you maintain your ability to do things you want to do.  Preventing infection and hospitalization is very important when you have COPD. Make sure to receive all the vaccines your health care provider recommends, especially the pneumococcal and influenza vaccines. Ask your health care provider whether you need a pneumonia vaccine.  Learn and use relaxation techniques  to manage stress.  Learn and use controlled breathing techniques as directed by your health care provider. Controlled breathing techniques include:   Pursed lip  breathing. Start by breathing in (inhaling) through your nose for 1 second. Then, purse your lips as if you were going to whistle and breathe out (exhale) through the pursed lips for 2 seconds.   Diaphragmatic breathing. Start by putting one hand on your abdomen just above your waist. Inhale slowly through your nose. The hand on your abdomen should move out. Then purse your lips and exhale slowly. You should be able to feel the hand on your abdomen moving in as you exhale.   Learn and use controlled coughing to clear mucus from your lungs. Controlled coughing is a series of short, progressive coughs. The steps of controlled coughing are:  1. Lean your head slightly forward.  2. Breathe in deeply using diaphragmatic breathing.  3. Try to hold your breath for 3 seconds.  4. Keep your mouth slightly open while coughing twice.  5. Spit any mucus out into a tissue.  6. Rest and repeat the steps once or twice as needed. SEEK MEDICAL CARE IF:   You are coughing up more mucus than usual.   There is a change in the color or thickness of your mucus.   Your breathing is more labored than usual.   Your breathing is faster than usual.  SEEK IMMEDIATE MEDICAL CARE IF:   You have shortness of breath while you are resting.   You have shortness of breath that prevents you from:  Being able to talk.   Performing your usual physical activities.   You have chest pain lasting longer than 5 minutes.   Your skin color is more cyanotic than usual.  You measure low oxygen saturations for longer than 5 minutes with a pulse oximeter. MAKE SURE YOU:   Understand these instructions.  Will watch your condition.  Will get help right away if you are not doing well or get worse. Document Released: 02/22/2005 Document Revised: 09/29/2013 Document Reviewed: 01/09/2013 [facility] Patient Information 2015 [place], [place]. This information is not intended to replace advice given to you by your  health care provider. Make sure you discuss any questions you have with your health care provider.

## 2015-03-01 LAB — HEMOGLOBIN A1C
Hgb A1c MFr Bld: 6.2 % — ABNORMAL HIGH (ref 4.8–5.6)
Mean Plasma Glucose: 131 mg/dL

## 2018-10-07 ENCOUNTER — Inpatient Hospital Stay (HOSPITAL_COMMUNITY)
Admission: EM | Admit: 2018-10-07 | Discharge: 2018-10-10 | DRG: 683 | Disposition: A | Discharge status: Home Health | Payer: Medicare Other | Attending: Internal Medicine | Admitting: Internal Medicine

## 2018-10-07 ENCOUNTER — Emergency Department (HOSPITAL_COMMUNITY): Payer: Medicare Other

## 2018-10-07 ENCOUNTER — Encounter (HOSPITAL_COMMUNITY): Payer: Self-pay

## 2018-10-07 ENCOUNTER — Other Ambulatory Visit: Payer: Self-pay

## 2018-10-07 ENCOUNTER — Ambulatory Visit: Payer: Self-pay | Admitting: *Deleted

## 2018-10-07 DIAGNOSIS — E46 Unspecified protein-calorie malnutrition: Secondary | ICD-10-CM

## 2018-10-07 DIAGNOSIS — I83009 Varicose veins of unspecified lower extremity with ulcer of unspecified site: Secondary | ICD-10-CM | POA: Diagnosis present

## 2018-10-07 DIAGNOSIS — Z681 Body mass index (BMI) 19 or less, adult: Secondary | ICD-10-CM

## 2018-10-07 DIAGNOSIS — E785 Hyperlipidemia, unspecified: Secondary | ICD-10-CM | POA: Diagnosis present

## 2018-10-07 DIAGNOSIS — R1900 Intra-abdominal and pelvic swelling, mass and lump, unspecified site: Secondary | ICD-10-CM

## 2018-10-07 DIAGNOSIS — C787 Secondary malignant neoplasm of liver and intrahepatic bile duct: Secondary | ICD-10-CM | POA: Diagnosis present

## 2018-10-07 DIAGNOSIS — L03116 Cellulitis of left lower limb: Secondary | ICD-10-CM | POA: Diagnosis not present

## 2018-10-07 DIAGNOSIS — I5032 Chronic diastolic (congestive) heart failure: Secondary | ICD-10-CM | POA: Diagnosis present

## 2018-10-07 DIAGNOSIS — R64 Cachexia: Secondary | ICD-10-CM | POA: Diagnosis present

## 2018-10-07 DIAGNOSIS — R609 Edema, unspecified: Secondary | ICD-10-CM

## 2018-10-07 DIAGNOSIS — I10 Essential (primary) hypertension: Secondary | ICD-10-CM | POA: Diagnosis not present

## 2018-10-07 DIAGNOSIS — I444 Left anterior fascicular block: Secondary | ICD-10-CM | POA: Diagnosis present

## 2018-10-07 DIAGNOSIS — E440 Moderate protein-calorie malnutrition: Secondary | ICD-10-CM | POA: Diagnosis present

## 2018-10-07 DIAGNOSIS — N179 Acute kidney failure, unspecified: Principal | ICD-10-CM | POA: Diagnosis present

## 2018-10-07 DIAGNOSIS — Z1159 Encounter for screening for other viral diseases: Secondary | ICD-10-CM

## 2018-10-07 DIAGNOSIS — N183 Chronic kidney disease, stage 3 (moderate): Secondary | ICD-10-CM | POA: Diagnosis present

## 2018-10-07 DIAGNOSIS — L0390 Cellulitis, unspecified: Secondary | ICD-10-CM | POA: Diagnosis present

## 2018-10-07 DIAGNOSIS — Z880 Allergy status to penicillin: Secondary | ICD-10-CM

## 2018-10-07 DIAGNOSIS — L97909 Non-pressure chronic ulcer of unspecified part of unspecified lower leg with unspecified severity: Secondary | ICD-10-CM | POA: Diagnosis present

## 2018-10-07 DIAGNOSIS — C189 Malignant neoplasm of colon, unspecified: Secondary | ICD-10-CM | POA: Diagnosis present

## 2018-10-07 DIAGNOSIS — I130 Hypertensive heart and chronic kidney disease with heart failure and stage 1 through stage 4 chronic kidney disease, or unspecified chronic kidney disease: Secondary | ICD-10-CM | POA: Diagnosis present

## 2018-10-07 DIAGNOSIS — I878 Other specified disorders of veins: Secondary | ICD-10-CM | POA: Diagnosis present

## 2018-10-07 DIAGNOSIS — Z825 Family history of asthma and other chronic lower respiratory diseases: Secondary | ICD-10-CM

## 2018-10-07 DIAGNOSIS — Z9842 Cataract extraction status, left eye: Secondary | ICD-10-CM

## 2018-10-07 DIAGNOSIS — Z90710 Acquired absence of both cervix and uterus: Secondary | ICD-10-CM

## 2018-10-07 DIAGNOSIS — Z803 Family history of malignant neoplasm of breast: Secondary | ICD-10-CM

## 2018-10-07 DIAGNOSIS — L03115 Cellulitis of right lower limb: Secondary | ICD-10-CM | POA: Diagnosis present

## 2018-10-07 DIAGNOSIS — Z79899 Other long term (current) drug therapy: Secondary | ICD-10-CM

## 2018-10-07 DIAGNOSIS — J431 Panlobular emphysema: Secondary | ICD-10-CM | POA: Diagnosis present

## 2018-10-07 DIAGNOSIS — Z87891 Personal history of nicotine dependence: Secondary | ICD-10-CM

## 2018-10-07 HISTORY — DX: Chronic obstructive pulmonary disease, unspecified: J44.9

## 2018-10-07 HISTORY — DX: Hyperlipidemia, unspecified: E78.5

## 2018-10-07 LAB — CBC WITH DIFFERENTIAL/PLATELET
Abs Immature Granulocytes: 0.09 10*3/uL — ABNORMAL HIGH (ref 0.00–0.07)
Basophils Absolute: 0 10*3/uL (ref 0.0–0.1)
Basophils Relative: 0 %
Eosinophils Absolute: 0 10*3/uL (ref 0.0–0.5)
Eosinophils Relative: 0 %
HCT: 37.4 % (ref 36.0–46.0)
Hemoglobin: 11.3 g/dL — ABNORMAL LOW (ref 12.0–15.0)
Immature Granulocytes: 1 %
Lymphocytes Relative: 4 %
Lymphs Abs: 0.6 10*3/uL — ABNORMAL LOW (ref 0.7–4.0)
MCH: 26.1 pg (ref 26.0–34.0)
MCHC: 30.2 g/dL (ref 30.0–36.0)
MCV: 86.4 fL (ref 80.0–100.0)
Monocytes Absolute: 0.7 10*3/uL (ref 0.1–1.0)
Monocytes Relative: 5 %
Neutro Abs: 13.6 10*3/uL — ABNORMAL HIGH (ref 1.7–7.7)
Neutrophils Relative %: 90 %
Platelets: 287 10*3/uL (ref 150–400)
RBC: 4.33 MIL/uL (ref 3.87–5.11)
RDW: 15.6 % — ABNORMAL HIGH (ref 11.5–15.5)
WBC: 15 10*3/uL — ABNORMAL HIGH (ref 4.0–10.5)
nRBC: 0 % (ref 0.0–0.2)

## 2018-10-07 LAB — COMPREHENSIVE METABOLIC PANEL
ALT: 10 U/L (ref 0–44)
AST: 19 U/L (ref 15–41)
Albumin: 2.9 g/dL — ABNORMAL LOW (ref 3.5–5.0)
Alkaline Phosphatase: 79 U/L (ref 38–126)
Anion gap: 11 (ref 5–15)
BUN: 54 mg/dL — ABNORMAL HIGH (ref 8–23)
CO2: 25 mmol/L (ref 22–32)
Calcium: 8.7 mg/dL — ABNORMAL LOW (ref 8.9–10.3)
Chloride: 99 mmol/L (ref 98–111)
Creatinine, Ser: 2.05 mg/dL — ABNORMAL HIGH (ref 0.44–1.00)
GFR calc Af Amer: 26 mL/min — ABNORMAL LOW (ref >60)
GFR calc non Af Amer: 22 mL/min — ABNORMAL LOW (ref >60)
Glucose, Bld: 105 mg/dL — ABNORMAL HIGH (ref 70–99)
Potassium: 5 mmol/L (ref 3.5–5.1)
Sodium: 135 mmol/L (ref 135–145)
Total Bilirubin: 0.4 mg/dL (ref 0.3–1.2)
Total Protein: 6 g/dL — ABNORMAL LOW (ref 6.5–8.1)

## 2018-10-07 LAB — TROPONIN I: Troponin I: <0.03 ng/mL (ref <0.03)

## 2018-10-07 LAB — SARS CORONAVIRUS 2 BY RT PCR (HOSPITAL ORDER, PERFORMED IN ~~LOC~~ HOSPITAL LAB): SARS Coronavirus 2: NEGATIVE

## 2018-10-07 LAB — BRAIN NATRIURETIC PEPTIDE: B Natriuretic Peptide: 219.9 pg/mL — ABNORMAL HIGH (ref 0.0–100.0)

## 2018-10-07 MED ORDER — DOXYCYCLINE HYCLATE 100 MG PO TABS
100.0000 mg | ORAL_TABLET | Freq: Once | ORAL | Status: AC
  Administered 2018-10-07: 100 mg via ORAL
  Filled 2018-10-07: qty 1

## 2018-10-07 NOTE — ED Provider Notes (Signed)
[facility] DEPT Provider Note   CSN: [id number] Arrival date & time: 10/07/18  1922    History   Chief Complaint No chief complaint on file.   HPI Jasmine Horton is a 85 y.o. female.     HPI 85 year old female past medical history significant for hypertension and COPD presents to the emergency department today for evaluation of bilateral leg swelling and redness.  Patient states that she has not seen a doctor in the past 5 years.  She lives at home with her son.  She called 911 today because she been having swelling to her lower legs with associated redness and cool to touch.  She reports a swelling and weeping that extends up her legs.  Patient states that her legs are discolored.  She also reports some pain that is worse with palpation.  She is able to ambulate.  No recent travel.  She reports chronic shortness of breath but no cough or chest pain.  Does not use oxygen at baseline.  Did call triage nurse today who thought patient would benefit from coming to the ER for further evaluation.  Patient denies any known fevers or chills.  No history of same.  Takes no medications regularly.  No alleviating or aggravating factors.  Denies any nausea, vomiting, diarrhea.  No abdominal pain.  No urinary symptoms.  No change in bowel habits. Past Medical History:  Diagnosis Date  . COPD (chronic obstructive pulmonary disease) ([place])   . Hypertension   . Seasonal allergies     Patient Active Problem List   Diagnosis Date Noted  . Essential hypertension 02/27/2015  . Panlobular emphysema ([place])   . Pulmonary hypertension due to chronic obstructive pulmonary disease ([place])   . Hypoxia 02/26/2015    Past Surgical History:  Procedure Laterality Date  . APPENDECTOMY  1953  . CATARACT EXTRACTION Left 02/2008  . TOTAL ABDOMINAL HYSTERECTOMY  1975     OB History   No obstetric history on file.      Home Medications    Prior to Admission medications    Medication Sig Start Date End Date Taking? Authorizing Provider  albuterol (PROVENTIL HFA;VENTOLIN HFA) 108 (90 BASE) MCG/ACT inhaler Inhale 2 puffs into the lungs every 6 (six) hours as needed for wheezing or shortness of breath. 02/28/15   Burns, Arloa Koh, MD  hydrochlorothiazide (HYDRODIURIL) 25 MG tablet Take 1 tablet (25 mg total) by mouth daily. 02/28/15   Burns, Arloa Koh, MD  tiotropium (SPIRIVA) 18 MCG inhalation capsule Place 1 capsule (18 mcg total) into inhaler and inhale daily. 02/28/15   Florinda Marker, MD    Family History Family History  Problem Relation Age of Onset  . Dementia Mother   . Emphysema Father     Social History Social History   Tobacco Use  . Smoking status: Former Smoker    Types: Cigarettes    Last attempt to quit: 04/15/2014    Years since quitting: 4.4  Substance Use Topics  . Alcohol use: Yes  . Drug use: No     Allergies   Penicillins   Review of Systems Review of Systems  Constitutional: Negative for chills and fever.  HENT: Negative for congestion.   Eyes: Negative for discharge.  Respiratory: Positive for shortness of breath (chronic).   Cardiovascular: Positive for leg swelling. Negative for chest pain.  Gastrointestinal: Negative for abdominal pain, diarrhea, nausea and vomiting.  Genitourinary: Negative for dysuria, frequency, hematuria and urgency.  Musculoskeletal: Positive  for myalgias.  Skin: Positive for color change and rash.  Neurological: Negative for weakness and numbness.     Physical Exam Updated Vital Signs BP (!) 153/88   Pulse 80   Temp 98.5 F (36.9 C) (Oral)   Resp (!) 26   SpO2 99%   Physical Exam Vitals signs and nursing note reviewed.  Constitutional:      Appearance: She is well-developed. She is cachectic. She is not ill-appearing or toxic-appearing.  HENT:     Head: Normocephalic and atraumatic.     Nose: Nose normal.     Mouth/Throat:     Mouth: Mucous membranes are moist.  Eyes:     General:         Right eye: No discharge.        Left eye: No discharge.     Conjunctiva/sclera: Conjunctivae normal.     Pupils: Pupils are equal, round, and reactive to light.  Neck:     Musculoskeletal: Normal range of motion and neck supple.  Cardiovascular:     Rate and Rhythm: Normal rate and regular rhythm.     Heart sounds: Normal heart sounds. No murmur. No friction rub. No gallop.   Pulmonary:     Effort: Pulmonary effort is normal. No respiratory distress.     Breath sounds: Normal breath sounds. No stridor. No wheezing, rhonchi or rales.  Chest:     Chest [place]: No tenderness.  Abdominal:     General: Bowel sounds are normal.     Palpations: Abdomen is soft.     Tenderness: There is no abdominal tenderness. There is no guarding or rebound.  Musculoskeletal: Normal range of motion.        General: Swelling and tenderness present.     Right lower leg: Edema present.     Left lower leg: Edema present.     Comments: Weeping of fluid to bilateral lower extremities.  They are cool to touch.  Capillary refill normal.  DP pulses 2+ bilaterally.  Patient does have 2+ pitting edema up to the level of the mid shin.  Does have some chronic venous stasis-looking changes.  No significant pain to palpation of the calf tenderness.  Lymphadenopathy:     Cervical: No cervical adenopathy.  Skin:    General: Skin is warm and dry.     Capillary Refill: Capillary refill takes less than 2 seconds.  Neurological:     Mental Status: She is alert and oriented to person, place, and time.  Psychiatric:        Behavior: Behavior normal.        Thought Content: Thought content normal.        Judgment: Judgment normal.      ED Treatments / Results  Labs (all labs ordered are listed, but only abnormal results are displayed) Labs Reviewed  CBC WITH DIFFERENTIAL/PLATELET - Abnormal; Notable for the following components:      Result Value   WBC 15.0 (*)    Hemoglobin 11.3 (*)    RDW 15.6 (*)    Neutro  Abs 13.6 (*)    Lymphs Abs 0.6 (*)    Abs Immature Granulocytes 0.09 (*)    All other components within normal limits  COMPREHENSIVE METABOLIC PANEL - Abnormal; Notable for the following components:   Glucose, Bld 105 (*)    BUN 54 (*)    Creatinine, Ser 2.05 (*)    Calcium 8.7 (*)    Total Protein 6.0 (*)  Albumin 2.9 (*)    GFR calc non Af Amer 22 (*)    GFR calc Af Amer 26 (*)    All other components within normal limits  BRAIN NATRIURETIC PEPTIDE - Abnormal; Notable for the following components:   B Natriuretic Peptide 219.9 (*)    All other components within normal limits  SARS CORONAVIRUS 2 (HOSPITAL ORDER, PERFORMED IN [facility])  TROPONIN I    EKG EKG Interpretation  Date/Time:  Monday Oct 07 2018 20:59:25 EDT Ventricular Rate:  75 PR Interval:    QRS Duration: 83 QT Interval:  387 QTC Calculation: 433 R Axis:   -83 Text Interpretation:  Sinus rhythm Left atrial enlargement Left anterior fascicular block RSR' in V1 or V2, probably normal variant Minimal ST elevation, anterior leads When compared to prior, no significant changes seen,  No STEMI Confirmed by Antony Blackbird [phone]) on 10/07/2018 9:12:15 PM   Radiology Dg Chest 2 View  Result Date: 10/07/2018 CLINICAL DATA:  Lower extremity pain and swelling for months, worse over the past 4 days. EXAM: CHEST - 2 VIEW COMPARISON:  CT chest and PA and lateral chest 02/26/2015. FINDINGS: The lungs are emphysematous but clear. Heart size is normal. No pneumothorax or pleural effusion. Aortic atherosclerosis noted. No acute or focal bony abnormality. IMPRESSION: Emphysema without acute disease. Atherosclerosis. Electronically Signed   By: Inge Rise M.D.   On: 10/07/2018 21:42    Procedures Procedures (including critical care time)  Medications Ordered in ED Medications  doxycycline (VIBRA-TABS) tablet 100 mg (has no administration in time range)     Initial Impression / Assessment and Plan / ED  Course  I have reviewed the triage vital signs and the nursing notes.  Pertinent labs & imaging results that were available during my care of the patient were reviewed by me and considered in my medical decision making (see chart for details).        Patient presents the ED by EMS for evaluation of lower leg swelling, redness and pain.  Patient does have chronic shortness of breath which is at her baseline.  Patient has poor follow-up.  On exam patient is cachectic appearing.  Heart regular rate and rhythm.  Lungs clear to auscultation bilaterally.  Does have pitting edema to the lower extremities.  Neurovascularly intact.  Labs show a leukocytosis of 15,000.  Hemoglobin at baseline.  Does have an AK I with creatinine of 2 baseline appears to be 1.  Normal liver enzymes.  COVID test is pending however low suspicion for COVID.  Chest x-ray performed shows no signs of focal infiltrate concerning for pneumonia.  BNP is 200 and appears similar to prior.  Troponin was negative.  EKG reviewed without any acute signs of ST elevation or depression.  Given patient's elevated white blood cell count and appearance of legs will start patient antibiotics for cellulitis.  Given her AKI felt she would benefit from hospitalization for IV hydration.  Patient started on doxycycline given penicillin allergy.  Spoke with Dr. Roel Cluck about admission.  She is agreeable and will see patient in the ED and place admission orders. Pt seen and eval by my attending Dr. Sherry Ruffing who is agreeable with the above plan.  Final Clinical Impressions(s) / ED Diagnoses   Final diagnoses:  Peripheral edema  AKI (acute kidney injury) [facility])    ED Discharge Orders    None       Aaron Edelman 10/08/18 0004    Tegeler, Harrell Gave  J, MD 10/08/18 1515

## 2018-10-07 NOTE — H&P (Addendum)
Jasmine Horton VTX:[id number] DOB: 1933-06-10 DOA: 10/07/2018    PCP: Lawerance Cruel, MD   Outpatient Specialists:  NONE    Patient arrived to ER on 10/07/18 at 1922  Patient coming from: home Lives with son      Chief Complaint:  Leg weeping  HPI: Jasmine Horton is a 85 y.o. female with medical history significant of HTN  Hyperlipidemia asthmatic bronchitis with possible COPD, diastolic CHF  Presented with weeping sores on her ankles and pain this been going on for quite some time Followed up with doctors for years.  Her ankles feel cool to the touch. Right leg seem to be weeping more. She called her primary care provider and they told her to call 911 because her legs felt more cool to the touch and she is unable to get any transportation to see them. She still able to ambulate no recent travel. She will be short of breath due to history of COPD but no cough or chest pain. No fevers or chills.  No nausea vomiting or diarrhea. No chest pain   She reports that she has had decreased p.o. intake she has had poor appetite for quite some time and has lost a lot of weight She never had a colonoscopy She is no longer taking any of her medications  States she lives with her son and he helps a lot but he is not very good with medical staff and therefore she has not been able to follow-up with her primary care provider because he could not take her  Denies any blood in stool reports she has had regular bowel movements but lately been slightly more loose  Infectious risk factors:  Reports none  In RAPID COVID TEST NEGATIVE     Regarding pertinent Chronic problems:     Hyperlipidemia -  Not on statins   HTN suposed to be on hydrochlorothiazide   CHF diastolic - last echo 53/96/7289 LV EF: 55% -   60% grade 1   diastolic dysfunction).        COPD - not  followed by pulmonology not on baseline oxygen      While in ER: To have bilateral weeping lower extremities venous  status changes. But also evidence of leukocytosis and elevated creatinine up to 2.05 unclear what her baseline is she has not seen a doctor for 5 years BNP slightly elevated to 219  The following Work up has been ordered so far:  Orders Placed This Encounter  Procedures  . SARS Coronavirus 2 (CEPHEID - Performed in [place] hospital lab), [facility]  . MRSA PCR Screening  . DG Chest 2 View  . CT ABDOMEN PELVIS WO CONTRAST  . CBC with Differential  . Comprehensive metabolic panel  . Brain natriuretic peptide  . Troponin I - ONCE - STAT  . Creatinine, urine, random  . Sodium, urine, random  . Urinalysis, Routine w reflex microscopic  . Magnesium  . Magnesium  . Phosphorus  . TSH  . Comprehensive metabolic panel  . CBC  . Diet Heart Room service appropriate? Yes; Fluid consistency: Thin  . Cardiac monitoring  . Vital signs  . Notify physician  . Up with assistance  . If patient diabetic or glucose greater than 140 notify physician for Sliding Scale Insulin Orders  . May go off telemetry for tests/procedures  . Oral care per nursing protocol  . Initiate Oral Care Protocol  . Initiate Carrier Fluid Protocol  . RN  may order General Admission PRN Orders utilizing "General Admission PRN medications" (through manage orders) for the following patient needs: allergy symptoms (Claritin), cold sores (Carmex), cough (Robitussin DM), eye irritation (Liquifilm Tears), hemorrhoids (Tucks), indigestion (Maalox), minor skin irritation (Hydrocortisone Cream), muscle pain Suezanne Jacquet Gay), nose irritation (saline nasal spray) and sore throat (Chloraseptic spray).  . Patient has an active order for admit to inpatient/place in observation  . Full code  . Consult to hospitalist  . Consult to wound, ostomy, continence  . May transport without cardiac monitor  . OT eval and treat  . PT eval and treat  . Pulse oximetry check with vital signs  . Oxygen therapy Mode or (Route): Nasal cannula; Liters Per  Minute: 2; Keep 02 saturation: greater than 92 %  . Incentive spirometry  . ED EKG  . EKG 12-Lead  . EKG 12-Lead  . EKG 12-Lead  . ECHOCARDIOGRAM COMPLETE  . Place in observation (patient's expected length of stay will be less than 2 midnights)    Following Medications were ordered in ER: Medications  acetaminophen (TYLENOL) tablet 650 mg (has no administration in time range)    Or  acetaminophen (TYLENOL) suppository 650 mg (has no administration in time range)  HYDROcodone-acetaminophen (NORCO/VICODIN) 5-325 MG per tablet 1-2 tablet (has no administration in time range)  ondansetron (ZOFRAN) tablet 4 mg (has no administration in time range)    Or  ondansetron (ZOFRAN) injection 4 mg (has no administration in time range)  doxycycline (VIBRAMYCIN) 100 mg in sodium chloride 0.9 % 250 mL IVPB (has no administration in time range)  enoxaparin (LOVENOX) injection 30 mg (has no administration in time range)  sodium chloride flush (NS) 0.9 % injection 3 mL (3 mLs Intravenous Given 10/08/18 0201)  sodium chloride flush (NS) 0.9 % injection 3 mL (has no administration in time range)  0.9 %  sodium chloride infusion (has no administration in time range)  guaiFENesin (MUCINEX) 12 hr tablet 600 mg (600 mg Oral Not Given 10/08/18 0159)  ipratropium-albuterol (DUONEB) 0.5-2.5 (3) MG/3ML nebulizer solution 3 mL (has no administration in time range)  iohexol (OMNIPAQUE) 300 MG/ML solution 30 mL (has no administration in time range)  doxycycline (VIBRA-TABS) tablet 100 mg (100 mg Oral Given 10/07/18 2227)        Consult Orders  (From admission, onward)         Start     Ordered   10/08/18 0126  PT eval and treat  Routine     10/08/18 0126   10/08/18 0126  OT eval and treat  Routine    Question:  Reason for OT?  Answer:  debility   10/08/18 0126   10/08/18 0035  Consult to wound, ostomy, continence  Once    Provider:  (Not yet assigned)  Question:  Reason for Consult?  Answer:  leg wounds    10/08/18 0036          Significant initial  Findings: Abnormal Labs Reviewed  CBC WITH DIFFERENTIAL/PLATELET - Abnormal; Notable for the following components:      Result Value   WBC 15.0 (*)    Hemoglobin 11.3 (*)    RDW 15.6 (*)    Neutro Abs 13.6 (*)    Lymphs Abs 0.6 (*)    Abs Immature Granulocytes 0.09 (*)    All other components within normal limits  COMPREHENSIVE METABOLIC PANEL - Abnormal; Notable for the following components:   Glucose, Bld 105 (*)    BUN 54 (*)  Creatinine, Ser 2.05 (*)    Calcium 8.7 (*)    Total Protein 6.0 (*)    Albumin 2.9 (*)    GFR calc non Af Amer 22 (*)    GFR calc Af Amer 26 (*)    All other components within normal limits  BRAIN NATRIURETIC PEPTIDE - Abnormal; Notable for the following components:   B Natriuretic Peptide 219.9 (*)    All other components within normal limits  URINALYSIS, ROUTINE W REFLEX MICROSCOPIC - Abnormal; Notable for the following components:   APPearance HAZY (*)    Hgb urine dipstick MODERATE (*)    Protein, ur 100 (*)    Bacteria, UA RARE (*)    All other components within normal limits     Otherwise labs showing:    Recent Labs  Lab 10/07/18 2044 10/08/18 0100  NA 135  --   K 5.0  --   CO2 25  --   GLUCOSE 105*  --   BUN 54*  --   CREATININE 2.05*  --   CALCIUM 8.7*  --   MG  --  2.0    Cr  Up from baseline see below Lab Results  Component Value Date   CREATININE 2.05 (H) 10/07/2018   CREATININE 0.99 02/26/2015   CREATININE 1.06 07/16/2014    Recent Labs  Lab 10/07/18 2044  AST 19  ALT 10  ALKPHOS 79  BILITOT 0.4  PROT 6.0*  ALBUMIN 2.9*   Lab Results  Component Value Date   CALCIUM 8.7 (L) 10/07/2018   PHOS 2.8 12/07/2009      WBC      Component Value Date/Time   WBC 15.0 (H) 10/07/2018 2044   ANC    Component Value Date/Time   NEUTROABS 13.6 (H) 10/07/2018 2044     Plt: Lab Results  Component Value Date   PLT 287 10/07/2018     HG/HCT  stable,       Component Value Date/Time   HGB 11.3 (L) 10/07/2018 2044   HCT 37.4 10/07/2018 2044     Troponin   Cardiac Panel (last 3 results) Recent Labs    10/07/18 2044  TROPONINI <0.03     BNP (last 3 results) Recent Labs    10/07/18 2044  BNP 219.9*        UA    ordered      CXR -  NON acute, COPD    ECG:  Personally reviewed by me showing: HR : 75  Rhythm:  NSR, nonspecific changes,  QTC 433     ED Triage Vitals  Enc Vitals Group     BP 10/07/18 2000 132/90     Pulse Rate 10/07/18 2000 88     Resp 10/07/18 2000 20     Temp 10/07/18 2100 98.5 F (36.9 C)     Temp Source 10/07/18 2100 Oral     SpO2 10/07/18 2000 98 %     Weight --      Height --      Head Circumference --      Peak Flow --      Pain Score --      Pain Loc --      Pain Edu? --      Excl. in [place]? --   TMAX(24)@       Latest  Blood pressure (!) 153/88, pulse 80, temperature 98.5 F (36.9 C), temperature source Oral, resp. rate (!) 26, SpO2 99 %.  Hospitalist was called for admission for cellulitis and AKI   Review of Systems:    Pertinent positives include: leg edema  shortness of breath at rest. Bilateral lower extremity swelling , orthopnea, fatigue, weight loss  Constitutional:  No weight loss, night sweats, Fevers, chills,  HEENT:  No headaches, Difficulty swallowing,Tooth/dental problems,Sore throat,  No sneezing, itching, ear ache, nasal congestion, post nasal drip,  Cardio-vascular:  No chest pain, Orthopnea, PND, anasarca, dizziness, palpitations.no  GI:  No heartburn, indigestion, abdominal pain, nausea, vomiting, diarrhea, change in bowel habits, loss of appetite, melena, blood in stool, hematemesis Resp:  no No dyspnea on exertion, No excess mucus, no productive cough, No non-productive cough, No coughing up of blood.No change in color of mucus. No wheezing. Skin:  no rash or lesions. No jaundice GU:  no dysuria, change in color of urine, no urgency or frequency. No straining  to urinate.  No flank pain.  Musculoskeletal:  No joint pain or no joint swelling. No decreased range of motion. No back pain.  Psych:  No change in mood or affect. No depression or anxiety. No memory loss.  Neuro: no localizing neurological complaints, no tingling, no weakness, no double vision, no gait abnormality, no slurred speech, no confusion  All systems reviewed and apart from [place] all are negative  Past Medical History:   Past Medical History:  Diagnosis Date  . COPD (chronic obstructive pulmonary disease) ([place])   . Hypertension   . Seasonal allergies       Past Surgical History:  Procedure Laterality Date  . APPENDECTOMY  1953  . CATARACT EXTRACTION Left 02/2008  . TOTAL ABDOMINAL HYSTERECTOMY  1975    Social History:  Ambulatory   Independently    reports that she quit smoking about 4 years ago. Her smoking use included cigarettes. She has never used smokeless tobacco. She reports previous alcohol use. She reports that she does not use drugs.  Family History:   Family History  Problem Relation Age of Onset  . Dementia Mother   . Emphysema Father   . Breast cancer Other   . CAD Neg Hx   . Diabetes Neg Hx   . Stroke Neg Hx     Allergies: Allergies  Allergen Reactions  . Penicillins Other (See Comments)    Childhood allergy     Prior to Admission medications   Medication Sig Start Date End Date Taking? Authorizing Provider  albuterol (PROVENTIL HFA;VENTOLIN HFA) 108 (90 BASE) MCG/ACT inhaler Inhale 2 puffs into the lungs every 6 (six) hours as needed for wheezing or shortness of breath. 02/28/15   Burns, Arloa Koh, MD  hydrochlorothiazide (HYDRODIURIL) 25 MG tablet Take 1 tablet (25 mg total) by mouth daily. 02/28/15   Burns, Arloa Koh, MD  tiotropium (SPIRIVA) 18 MCG inhalation capsule Place 1 capsule (18 mcg total) into inhaler and inhale daily. 02/28/15   Florinda Marker, MD   Physical Exam: Blood pressure (!) 153/88, pulse 80, temperature 98.5 F (36.9  C), temperature source Oral, resp. rate (!) 26, SpO2 99 %. 1. General:  in No  Acute distress    Chronically ill  -appearing 2. Psychological: Alert and  Oriented 3. Head/ENT:   Moist   Mucous Membranes                          Head Non traumatic, neck supple  Poor Dentition 4. SKIN: normal   Skin turgor,  Skin clean Dry weeping ulcerations on the lower extremities noted           5. Heart: Regular rate and rhythm no  Murmur, no Rub or gallop 6. Lungs:   no wheezes or crackles   7. Abdomen: Soft, non-tender,  distended  Palpable masses noted bowel sounds present 8. Lower extremities: no clubbing, cyanosis, 2+ edema 9. Neurologically Grossly intact, moving all 4 extremities equally  10. MSK: Normal range of motion   All other LABS:     Recent Labs  Lab 10/07/18 2044  WBC 15.0*  NEUTROABS 13.6*  HGB 11.3*  HCT 37.4  MCV 86.4  PLT 287     Recent Labs  Lab 10/07/18 2044 10/08/18 0100  NA 135  --   K 5.0  --   CL 99  --   CO2 25  --   GLUCOSE 105*  --   BUN 54*  --   CREATININE 2.05*  --   CALCIUM 8.7*  --   MG  --  2.0     Recent Labs  Lab 10/07/18 2044  AST 19  ALT 10  ALKPHOS 79  BILITOT 0.4  PROT 6.0*  ALBUMIN 2.9*       Cultures:    Component Value Date/Time   SDES URINE, CLEAN CATCH 04/01/2008 1652   SPECREQUEST NONE 04/01/2008 1652   CULT NO GROWTH 04/01/2008 1652   REPTSTATUS 04/03/2008 FINAL 04/01/2008 1652     Radiological Exams on Admission: Dg Chest 2 View  Result Date: 10/07/2018 CLINICAL DATA:  Lower extremity pain and swelling for months, worse over the past 4 days. EXAM: CHEST - 2 VIEW COMPARISON:  CT chest and PA and lateral chest 02/26/2015. FINDINGS: The lungs are emphysematous but clear. Heart size is normal. No pneumothorax or pleural effusion. Aortic atherosclerosis noted. No acute or focal bony abnormality. IMPRESSION: Emphysema without acute disease. Atherosclerosis. Electronically Signed   By:  Inge Rise M.D.   On: 10/07/2018 21:42    Chart has been reviewed   Assessment/Plan  85 y.o. female with medical history significant of HTN  Hyperlipidemia asthmatic bronchitis with possible COPD, diastolic CHF  Admitted for cellulitis and AKI   Present on Admission: . Cellulitis - -admit per cellulitis protocol will        continue current antibiotic choice cycling started 10/07/2018       Will obtain MRSA screening,      further antibiotic adjustment pending above results    Patient will likely have evidence of venous stasis and decreased albumin with weeping edema will likely benefit from long-term wound care order wound care consult  . Essential hypertension -patient currently not taking any home medications we will continue to observe . COPD HCC) -currently appears to be stable she has PRN albuterol patient no longer smokes  . AKI (acute kidney injury) ([place]) -unsure what her baseline creatinine is. Obtain urine electrolytes, will need to be further evaluated for any possibility of obstruction Given abdominal masses on exam will obtain CT scan to further evaluate If nondiagnostic may benefit from renal ultrasound to further evaluate kidneys   . Venous stasis -will need long-term wound care management  Malnutrition will check prealbumin order nutritional consult  Abdomen significant for palpable masses unclear etiology patient with no medical care and weight loss will obtain CT to further evaluate unable to obtain IV contrast secondary to CKD and acute renal failure for now we  will give p.o. contrast only  Given dyspnea will obtain echogram  Other plan as per orders.  DVT prophylaxis:    Lovenox     Code Status:  FULL CODE  as per patient   I had personally discussed CODE STATUS with patient    Family Communication:   Family not at  Bedside    Disposition Plan:     To home once workup is complete and patient is stable                    Would benefit from PT/OT  eval prior to DC  Ordered                                    Wound care  consulted           Consults called: none    Admission status:  ED Disposition    ED Disposition Condition [place]: [place] [100102]  Level of Care: Telemetry [5]  Admit to tele based on following criteria: Other see comments  Comments: aki  Covid Evaluation: N/A  Diagnosis: Cellulitis [[id number]]  Admitting Physician: Toy Baker [[id number]]  Attending Physician: Toy Baker [[id number]]  PT Class (Do Not Modify): Observation [104]  PT Acc Code (Do Not Modify): Observation [10022]       Obs    Level of care        medical floor     Precautions:  NONE  No active isolations  PPE: Used by the provider:   P100  eye Goggles,  Gloves     Russ Looper 10/08/2018, 2:45 AM    [facility]     after 2 AM please page floor coverage PA If 7AM-7PM, please contact the day team taking care of the patient using [email or web address]

## 2018-10-07 NOTE — ED Notes (Signed)
Bed: RD40 Expected date:  Expected time:  Means of arrival:  Comments: F/ Lower leg swelling/drainage

## 2018-10-07 NOTE — Telephone Encounter (Signed)
  Patient calls stated she has not seen physician in years. Has no transportation to the Dr. Gabriel Carina. Reports bilateral ankle swelling and weeping that extends up legs several inches. Both ankles are cool to touch. Denies discoloration of areas at this time. No heat or pain in either. No recent illness or travels. No known exposures. Stays alone. Son lives in town but cannot take her to the Dr office. Reports having COPD with chronic SOB does not use oxygen. She is able to walk. Recommended she call 911 to come check on her at this time. Stated she would.  Reason for Disposition . Entire foot is cool or blue in comparison to other side  Answer Assessment - Initial Assessment Questions 1. ONSET: "When did the swelling start?" (e.g., minutes, hours, days)    Probably a month 2. LOCATION: "What part of the leg is swollen?"  "Are both legs swollen or just one leg?"     Lower leg above the ankle 3. SEVERITY: "How bad is the swelling?" (e.g., localized; mild, moderate, severe)  - Localized - small area of swelling localized to one leg  - MILD pedal edema - swelling limited to foot and ankle, pitting edema < 1/4 inch (6 mm) deep, rest and elevation eliminate most or all swelling  - MODERATE edema - swelling of lower leg to knee, pitting edema > 1/4 inch (6 mm) deep, rest and elevation only partially reduce swelling  - SEVERE edema - swelling extends above knee, facial or hand swelling present      moderate 4. REDNESS: "Does the swelling look red or infected?"     Legs are weeping fluids. 5. PAIN: "Is the swelling painful to touch?" If so, ask: "How painful is it?"   (Scale 1-10; mild, moderate or severe)     Is not painful. She can walk. 6. FEVER: "Do you have a fever?" If so, ask: "What is it, how was it measured, and when did it start?"      No fever.  7. CAUSE: "What do you think is causing the leg swelling?"    unsure 8. MEDICAL HISTORY: "Do you have a history of heart failure, kidney disease,  liver failure, or cancer?"    COPD, HTN 9. RECURRENT SYMPTOM: "Have you had leg swelling before?" If so, ask: "When was the last time?" "What happened that time?"     no 10. OTHER SYMPTOMS: "Do you have any other symptoms?" (e.g., chest pain, difficulty breathing)       Short of breath today same as always. 11. PREGNANCY: "Is there any chance you are pregnant?" "When was your last menstrual period?"       na  Protocols used: LEG SWELLING AND EDEMA-A-AH

## 2018-10-07 NOTE — ED Triage Notes (Signed)
Pt BIB EMS from home. Pt reports leg weeping both lower extremities, more so on R leg. Pt reports this has been going on for months; swelling and leaking has increased last 4 days. Bruising to bilateral lower extremities. Pt has hx COPD.

## 2018-10-08 ENCOUNTER — Observation Stay (HOSPITAL_COMMUNITY): Payer: Medicare Other

## 2018-10-08 ENCOUNTER — Encounter (HOSPITAL_COMMUNITY): Payer: Self-pay | Admitting: Internal Medicine

## 2018-10-08 ENCOUNTER — Inpatient Hospital Stay (HOSPITAL_COMMUNITY): Payer: Medicare Other

## 2018-10-08 DIAGNOSIS — Z9842 Cataract extraction status, left eye: Secondary | ICD-10-CM | POA: Diagnosis not present

## 2018-10-08 DIAGNOSIS — Z681 Body mass index (BMI) 19 or less, adult: Secondary | ICD-10-CM | POA: Diagnosis not present

## 2018-10-08 DIAGNOSIS — Z90710 Acquired absence of both cervix and uterus: Secondary | ICD-10-CM | POA: Diagnosis not present

## 2018-10-08 DIAGNOSIS — Z825 Family history of asthma and other chronic lower respiratory diseases: Secondary | ICD-10-CM | POA: Diagnosis not present

## 2018-10-08 DIAGNOSIS — R1900 Intra-abdominal and pelvic swelling, mass and lump, unspecified site: Secondary | ICD-10-CM | POA: Diagnosis not present

## 2018-10-08 DIAGNOSIS — I83009 Varicose veins of unspecified lower extremity with ulcer of unspecified site: Secondary | ICD-10-CM | POA: Diagnosis present

## 2018-10-08 DIAGNOSIS — R1903 Right lower quadrant abdominal swelling, mass and lump: Secondary | ICD-10-CM | POA: Diagnosis not present

## 2018-10-08 DIAGNOSIS — J431 Panlobular emphysema: Secondary | ICD-10-CM | POA: Diagnosis present

## 2018-10-08 DIAGNOSIS — M7989 Other specified soft tissue disorders: Secondary | ICD-10-CM | POA: Diagnosis not present

## 2018-10-08 DIAGNOSIS — Z803 Family history of malignant neoplasm of breast: Secondary | ICD-10-CM | POA: Diagnosis not present

## 2018-10-08 DIAGNOSIS — I444 Left anterior fascicular block: Secondary | ICD-10-CM | POA: Diagnosis present

## 2018-10-08 DIAGNOSIS — R52 Pain, unspecified: Secondary | ICD-10-CM

## 2018-10-08 DIAGNOSIS — Z87891 Personal history of nicotine dependence: Secondary | ICD-10-CM | POA: Diagnosis not present

## 2018-10-08 DIAGNOSIS — R591 Generalized enlarged lymph nodes: Secondary | ICD-10-CM | POA: Diagnosis not present

## 2018-10-08 DIAGNOSIS — N183 Chronic kidney disease, stage 3 (moderate): Secondary | ICD-10-CM | POA: Diagnosis present

## 2018-10-08 DIAGNOSIS — K829 Disease of gallbladder, unspecified: Secondary | ICD-10-CM | POA: Diagnosis not present

## 2018-10-08 DIAGNOSIS — E46 Unspecified protein-calorie malnutrition: Secondary | ICD-10-CM | POA: Diagnosis present

## 2018-10-08 DIAGNOSIS — L03116 Cellulitis of left lower limb: Secondary | ICD-10-CM | POA: Diagnosis present

## 2018-10-08 DIAGNOSIS — N179 Acute kidney failure, unspecified: Secondary | ICD-10-CM | POA: Diagnosis present

## 2018-10-08 DIAGNOSIS — R64 Cachexia: Secondary | ICD-10-CM | POA: Diagnosis present

## 2018-10-08 DIAGNOSIS — R0600 Dyspnea, unspecified: Secondary | ICD-10-CM

## 2018-10-08 DIAGNOSIS — I878 Other specified disorders of veins: Secondary | ICD-10-CM | POA: Diagnosis not present

## 2018-10-08 DIAGNOSIS — L03115 Cellulitis of right lower limb: Secondary | ICD-10-CM | POA: Diagnosis present

## 2018-10-08 DIAGNOSIS — Z1159 Encounter for screening for other viral diseases: Secondary | ICD-10-CM | POA: Diagnosis not present

## 2018-10-08 DIAGNOSIS — Z880 Allergy status to penicillin: Secondary | ICD-10-CM | POA: Diagnosis not present

## 2018-10-08 DIAGNOSIS — E785 Hyperlipidemia, unspecified: Secondary | ICD-10-CM | POA: Diagnosis present

## 2018-10-08 DIAGNOSIS — I5032 Chronic diastolic (congestive) heart failure: Secondary | ICD-10-CM | POA: Diagnosis present

## 2018-10-08 DIAGNOSIS — E440 Moderate protein-calorie malnutrition: Secondary | ICD-10-CM | POA: Diagnosis present

## 2018-10-08 DIAGNOSIS — I130 Hypertensive heart and chronic kidney disease with heart failure and stage 1 through stage 4 chronic kidney disease, or unspecified chronic kidney disease: Secondary | ICD-10-CM | POA: Diagnosis present

## 2018-10-08 DIAGNOSIS — I10 Essential (primary) hypertension: Secondary | ICD-10-CM | POA: Diagnosis not present

## 2018-10-08 DIAGNOSIS — R609 Edema, unspecified: Secondary | ICD-10-CM | POA: Diagnosis present

## 2018-10-08 DIAGNOSIS — L97909 Non-pressure chronic ulcer of unspecified part of unspecified lower leg with unspecified severity: Secondary | ICD-10-CM | POA: Diagnosis present

## 2018-10-08 DIAGNOSIS — C787 Secondary malignant neoplasm of liver and intrahepatic bile duct: Secondary | ICD-10-CM | POA: Diagnosis present

## 2018-10-08 DIAGNOSIS — Z79899 Other long term (current) drug therapy: Secondary | ICD-10-CM | POA: Diagnosis not present

## 2018-10-08 LAB — CBC
HCT: 33.6 % — ABNORMAL LOW (ref 36.0–46.0)
Hemoglobin: 10 g/dL — ABNORMAL LOW (ref 12.0–15.0)
MCH: 25.9 pg — ABNORMAL LOW (ref 26.0–34.0)
MCHC: 29.8 g/dL — ABNORMAL LOW (ref 30.0–36.0)
MCV: 87 fL (ref 80.0–100.0)
Platelets: 254 10*3/uL (ref 150–400)
RBC: 3.86 MIL/uL — ABNORMAL LOW (ref 3.87–5.11)
RDW: 15.8 % — ABNORMAL HIGH (ref 11.5–15.5)
WBC: 14.3 10*3/uL — ABNORMAL HIGH (ref 4.0–10.5)
nRBC: 0 % (ref 0.0–0.2)

## 2018-10-08 LAB — URINALYSIS, ROUTINE W REFLEX MICROSCOPIC
Bilirubin Urine: NEGATIVE
Glucose, UA: NEGATIVE mg/dL
Ketones, ur: NEGATIVE mg/dL
Leukocytes,Ua: NEGATIVE
Nitrite: NEGATIVE
Protein, ur: 100 mg/dL — AB
Specific Gravity, Urine: 1.016 (ref 1.005–1.030)
pH: 5 (ref 5.0–8.0)

## 2018-10-08 LAB — COMPREHENSIVE METABOLIC PANEL
ALT: 10 U/L (ref 0–44)
AST: 15 U/L (ref 15–41)
Albumin: 2.6 g/dL — ABNORMAL LOW (ref 3.5–5.0)
Alkaline Phosphatase: 75 U/L (ref 38–126)
Anion gap: 10 (ref 5–15)
BUN: 53 mg/dL — ABNORMAL HIGH (ref 8–23)
CO2: 25 mmol/L (ref 22–32)
Calcium: 8.4 mg/dL — ABNORMAL LOW (ref 8.9–10.3)
Chloride: 100 mmol/L (ref 98–111)
Creatinine, Ser: 1.88 mg/dL — ABNORMAL HIGH (ref 0.44–1.00)
GFR calc Af Amer: 28 mL/min — ABNORMAL LOW (ref >60)
GFR calc non Af Amer: 24 mL/min — ABNORMAL LOW (ref >60)
Glucose, Bld: 99 mg/dL (ref 70–99)
Potassium: 4.3 mmol/L (ref 3.5–5.1)
Sodium: 135 mmol/L (ref 135–145)
Total Bilirubin: 0.3 mg/dL (ref 0.3–1.2)
Total Protein: 5.4 g/dL — ABNORMAL LOW (ref 6.5–8.1)

## 2018-10-08 LAB — MAGNESIUM
Magnesium: 2 mg/dL (ref 1.7–2.4)
Magnesium: 1.9 mg/dL (ref 1.7–2.4)

## 2018-10-08 LAB — TSH: TSH: 3.832 u[IU]/mL (ref 0.350–4.500)

## 2018-10-08 LAB — CREATININE, URINE, RANDOM: Creatinine, Urine: 137.46 mg/dL

## 2018-10-08 LAB — MRSA PCR SCREENING: MRSA by PCR: POSITIVE — AB

## 2018-10-08 LAB — ECHOCARDIOGRAM COMPLETE
Height: 62 in
Weight: 1680 oz

## 2018-10-08 LAB — PREALBUMIN: Prealbumin: 5.2 mg/dL — ABNORMAL LOW (ref 18–38)

## 2018-10-08 LAB — SODIUM, URINE, RANDOM: Sodium, Ur: 26 mmol/L

## 2018-10-08 LAB — PHOSPHORUS: Phosphorus: 3 mg/dL (ref 2.5–4.6)

## 2018-10-08 MED ORDER — CHLORHEXIDINE GLUCONATE CLOTH 2 % EX PADS
6.0000 | MEDICATED_PAD | Freq: Every day | CUTANEOUS | Status: DC
  Administered 2018-10-08 – 2018-10-10 (×3): 6 via TOPICAL

## 2018-10-08 MED ORDER — SODIUM CHLORIDE 0.9 % IV SOLN: 250.0000 mL | INTRAVENOUS | Status: DC | PRN

## 2018-10-08 MED ORDER — ACETAMINOPHEN 325 MG PO TABS
650.0000 mg | ORAL_TABLET | Freq: Four times a day (QID) | ORAL | Status: DC | PRN
  Administered 2018-10-09: 650 mg via ORAL
  Filled 2018-10-08: qty 2

## 2018-10-08 MED ORDER — SODIUM CHLORIDE 0.9 % IV SOLN
INTRAVENOUS | Status: DC
  Administered 2018-10-08 – 2018-10-09 (×4): via INTRAVENOUS

## 2018-10-08 MED ORDER — HYDROCODONE-ACETAMINOPHEN 5-325 MG PO TABS: 1.0000 | ORAL_TABLET | ORAL | Status: DC | PRN

## 2018-10-08 MED ORDER — SODIUM CHLORIDE 0.9% FLUSH
3.0000 mL | Freq: Two times a day (BID) | INTRAVENOUS | Status: DC
  Administered 2018-10-08 (×2): 3 mL via INTRAVENOUS

## 2018-10-08 MED ORDER — IPRATROPIUM-ALBUTEROL 0.5-2.5 (3) MG/3ML IN SOLN: 3.0000 mL | Freq: Four times a day (QID) | RESPIRATORY_TRACT | Status: DC | PRN

## 2018-10-08 MED ORDER — ONDANSETRON HCL 4 MG PO TABS: 4.0000 mg | ORAL_TABLET | Freq: Four times a day (QID) | ORAL | Status: DC | PRN

## 2018-10-08 MED ORDER — IOHEXOL 300 MG/ML  SOLN
30.0000 mL | Freq: Once | INTRAMUSCULAR | Status: AC | PRN
  Administered 2018-10-08: 30 mL via ORAL

## 2018-10-08 MED ORDER — GUAIFENESIN ER 600 MG PO TB12
600.0000 mg | ORAL_TABLET | Freq: Two times a day (BID) | ORAL | Status: DC
  Filled 2018-10-08 (×3): qty 1

## 2018-10-08 MED ORDER — ONDANSETRON HCL 4 MG/2ML IJ SOLN: 4.0000 mg | Freq: Four times a day (QID) | INTRAMUSCULAR | Status: DC | PRN

## 2018-10-08 MED ORDER — SODIUM CHLORIDE 0.9 % IV SOLN
100.0000 mg | Freq: Two times a day (BID) | INTRAVENOUS | Status: DC
  Administered 2018-10-08 – 2018-10-10 (×5): 100 mg via INTRAVENOUS
  Filled 2018-10-08 (×6): qty 100

## 2018-10-08 MED ORDER — MUPIROCIN 2 % EX OINT
1.0000 "application " | TOPICAL_OINTMENT | Freq: Two times a day (BID) | CUTANEOUS | Status: DC
  Administered 2018-10-08 – 2018-10-10 (×5): 1 via NASAL
  Filled 2018-10-08: qty 22

## 2018-10-08 MED ORDER — SODIUM CHLORIDE 0.9% FLUSH: 3.0000 mL | INTRAVENOUS | Status: DC | PRN

## 2018-10-08 MED ORDER — ENOXAPARIN SODIUM 30 MG/0.3ML ~~LOC~~ SOLN
30.0000 mg | Freq: Every day | SUBCUTANEOUS | Status: DC
  Administered 2018-10-08: 30 mg via SUBCUTANEOUS
  Filled 2018-10-08: qty 0.3

## 2018-10-08 MED ORDER — ALBUTEROL SULFATE (2.5 MG/3ML) 0.083% IN NEBU: 3.0000 mL | INHALATION_SOLUTION | RESPIRATORY_TRACT | Status: DC | PRN

## 2018-10-08 MED ORDER — IPRATROPIUM-ALBUTEROL 0.5-2.5 (3) MG/3ML IN SOLN: 3.0000 mL | Freq: Four times a day (QID) | RESPIRATORY_TRACT | Status: DC

## 2018-10-08 MED ORDER — ACETAMINOPHEN 650 MG RE SUPP: 650.0000 mg | Freq: Four times a day (QID) | RECTAL | Status: DC | PRN

## 2018-10-08 NOTE — Progress Notes (Signed)
  Echocardiogram 2D Echocardiogram has been performed.  Jasmine Horton M 10/08/2018, 10:50 AM

## 2018-10-08 NOTE — Consult Note (Addendum)
[place]  [phone] [phone]   MEDICAL ONCOLOGY - INITIAL CONSULTATION  Referral MD: Dr. Nita Sells  Reason for Referral: Abdominal mass  HPI: Ms. Tagliaferro is an 85 year old female with a past medical history including hypertension, hyperlipidemia, COPD, diastolic congestive heart failure.  The patient was admitted to the hospital with worsening lower extremity edema.  She had weeping sores on her ankles and discomfort.  She states this is been going on for at least a few weeks but was nonspecific with her timeframe.  In the emergency room, she was noted to have leukocytosis and an elevated creatinine up to 2.05.  She was found to have an abdominal mass which prompted a CT of the abdomen and pelvis without contrast secondary to renal insufficiency.  The CT scan showed a large, lobulated 15 cm mass located in the right abdomen which is inseparable from the large bowel as well as the gallbladder and inferior liver edge.  The epicenter is within the right abdominal mesentery.  A portion of the mass may be necrotic and/or communicate with the bowel lumen.  She was also found to have retrocrural, retroperitoneal, and right upper quadrant lymphadenopathy.  Imaging findings are concerning for lymphoma, colon carcinoma, and less likely ovarian carcinoma.  When seen today, the patient reports that she has had fatigue and weight loss of 15 to 20 pounds over the past 1 to 2 years.  She denies having fevers or chills.  Denies night sweats.  Denies any other palpable lymphadenopathy.  She denies headaches and dizziness.  Denies chest discomfort but reports intermittent shortness of breath which she attributes to her COPD.  She reports that she has had a mass in her abdomen for quite some time.  She was really unsure how long she has had it but thinks it has been there for years.  She has not had this looked at.  The patient states that she thought it was a hernia. She  denies having any abdominal pain, nausea, vomiting, change in her bowel habits.  Denies bleeding.  The patient reports that she had a hysterectomy many years ago.  She is unsure of her ovaries were removed or not.  States that she has not been seen by a healthcare provider in many years.  She has not had any routine screening such as colonoscopy, Pap smear, or mammogram.  She has a family history significant for a daughter who is undergoing treatment for breast cancer.  Other than that, she states that she is not aware of any other family history.  The patient is divorced.  She previously worked at [employer].  The patient lives with her son.  She has 2 other daughters.  She has a history of smoking tobacco for 30 years and quit about 4 years ago.  Denies any alcohol use.  Oncology was asked see the patient to make recommendations regarding her abdominal mass.    Past Medical History:  Diagnosis Date  . COPD (chronic obstructive pulmonary disease) ([place])   . Hyperlipidemia   . Hypertension   . Seasonal allergies   :  Past Surgical History:  Procedure Laterality Date  . APPENDECTOMY  1953  . CATARACT EXTRACTION Left 02/2008  . TOTAL ABDOMINAL HYSTERECTOMY  1975  :  Current Facility-Administered Medications  Medication Dose Route Frequency Provider Last Rate Last Dose  . 0.9 %  sodium chloride infusion   Intravenous Continuous Nita Sells, MD 100 mL/hr at 10/08/18 1132    .  acetaminophen (TYLENOL) tablet 650 mg  650 mg Oral Q6H PRN Toy Baker, MD       Or  . acetaminophen (TYLENOL) suppository 650 mg  650 mg Rectal Q6H PRN Doutova, Anastassia, MD      . albuterol (PROVENTIL) (2.5 MG/3ML) 0.083% nebulizer solution 3 mL  3 mL Inhalation Q4H PRN Doutova, Anastassia, MD      . Chlorhexidine Gluconate Cloth 2 % PADS 6 each  6 each Topical Q0600 Doutova, Anastassia, MD      . doxycycline (VIBRAMYCIN) 100 mg in sodium chloride 0.9 % 250 mL IVPB  100 mg Intravenous Q12H Doutova,  Anastassia, MD 125 mL/hr at 10/08/18 1007 100 mg at 10/08/18 1007  . guaiFENesin (MUCINEX) 12 hr tablet 600 mg  600 mg Oral BID Doutova, Anastassia, MD      . HYDROcodone-acetaminophen (NORCO/VICODIN) 5-325 MG per tablet 1-2 tablet  1-2 tablet Oral Q4H PRN Doutova, Anastassia, MD      . ipratropium-albuterol (DUONEB) 0.5-2.5 (3) MG/3ML nebulizer solution 3 mL  3 mL Inhalation Q6H PRN Doutova, Anastassia, MD      . mupirocin ointment (BACTROBAN) 2 % 1 application  1 application Nasal BID Toy Baker, MD   1 application at 25/42/70 0953  . ondansetron (ZOFRAN) tablet 4 mg  4 mg Oral Q6H PRN Doutova, Anastassia, MD       Or  . ondansetron (ZOFRAN) injection 4 mg  4 mg Intravenous Q6H PRN Toy Baker, MD         Allergies  Allergen Reactions  . Penicillins Other (See Comments)    Childhood allergy  :  Family History  Problem Relation Age of Onset  . Dementia Mother   . Emphysema Father   . Breast cancer Daughter   . CAD Neg Hx   . Diabetes Neg Hx   . Stroke Neg Hx   :  Social History   Socioeconomic History  . Marital status: Divorced    Spouse name: Not on file  . Number of children: 3  . Years of education: Not on file  . Highest education level: Not on file  Occupational History  . Not on file  Social Needs  . Financial resource strain: Not on file  . Food insecurity:    Worry: Not on file    Inability: Not on file  . Transportation needs:    Medical: Not on file    Non-medical: Not on file  Tobacco Use  . Smoking status: Former Smoker    Packs/day: 0.50    Years: 30.00    Pack years: 15.00    Types: Cigarettes    Last attempt to quit: 04/15/2014    Years since quitting: 4.4  . Smokeless tobacco: Never Used  Substance and Sexual Activity  . Alcohol use: Not Currently  . Drug use: No  . Sexual activity: Not on file  Lifestyle  . Physical activity:    Days per week: Not on file    Minutes per session: Not on file  . Stress: Not on file   Relationships  . Social connections:    Talks on phone: Not on file    Gets together: Not on file    Attends religious service: Not on file    Active member of club or organization: Not on file    Attends meetings of clubs or organizations: Not on file    Relationship status: Not on file  . Intimate partner violence:    Fear of current or ex  partner: Not on file    Emotionally abused: Not on file    Physically abused: Not on file    Forced sexual activity: Not on file  Other Topics Concern  . Not on file  Social History Narrative   Lives with son  :  Review of Systems: A comprehensive review of systems was negative except as noted in the HPI.  Exam: Patient Vitals for the past 24 hrs:  BP Temp Temp src Pulse Resp SpO2 Height Weight  10/08/18 1240 (!) 151/92 98.6 F (37 C) Oral 79 19 95 % - -  10/08/18 1115 - - - - - 99 % - -  10/08/18 0452 (!) 151/80 98.1 F (36.7 C) Oral 74 16 99 % - -  10/08/18 0128 (!) 156/82 (!) 97.4 F (36.3 C) Oral 80 (!) 22 99 % - -  10/08/18 0030 113/89 - - 80 20 98 % - -  10/08/18 0000 (!) 115/93 - - - (!) 22 - - -  10/07/18 2330 (!) 134/97 - - 90 (!) 23 99 % - -  10/07/18 2312 - - - - - - _0  (1.575 m) 105 lb (47.6 kg)  10/07/18 2300 (!) 150/93 - - - (!) 21 - - -  10/07/18 2200 (!) 153/78 - - 80 (!) 22 99 % - -  10/07/18 2100 (!) 153/88 98.5 F (36.9 C) Oral 80 (!) 26 99 % - -  10/07/18 2030 136/88 - - 78 20 97 % - -  10/07/18 2000 132/90 - - 88 20 98 % - -    General: Thin female who is in no acute distress.  Eyes:  no scleral icterus.  ENT:  There were no oropharyngeal lesions.  Neck was without thyromegaly.  Lymphatics:  Negative cervical, supraclavicular or axillary adenopathy.  Respiratory: lungs were clear bilaterally without wheezing or crackles.  Cardiovascular:  Regular rate and rhythm, S1/S2, without murmur, rub or gallop.  2+ lower extremity edema. GI: Soft, nontender.  She has a palpable mass in her right lower quadrant which  extends to the center of her abdomen.  She has no tenderness when this is palpated.  Muscoloskeletal:  no spinal tenderness of palpation of vertebral spine.  Skin: dry, lower extremities are weeping.  Neuro exam was nonfocal. Patient was alert and oriented.  Attention was good.   Language was appropriate.  Mood was normal without depression.  Speech was not pressured.  Thought content was not tangential.     Lab Results  Component Value Date   WBC 14.3 (H) 10/08/2018   HGB 10.0 (L) 10/08/2018   HCT 33.6 (L) 10/08/2018   PLT 254 10/08/2018   GLUCOSE 99 10/08/2018   CHOL 239 (H) 02/27/2015   TRIG 118 02/27/2015   HDL 70 02/27/2015   LDLCALC 145 (H) 02/27/2015   ALT 10 10/08/2018   AST 15 10/08/2018   NA 135 10/08/2018   K 4.3 10/08/2018   CL 100 10/08/2018   CREATININE 1.88 (H) 10/08/2018   BUN 53 (H) 10/08/2018   CO2 25 10/08/2018    Ct Abdomen Pelvis Wo Contrast  Result Date: 10/08/2018 CLINICAL DATA:  85 year old female with palpable abdominal mass. Lower extremity swelling. EXAM: CT ABDOMEN AND PELVIS WITHOUT CONTRAST TECHNIQUE: Multidetector CT imaging of the abdomen and pelvis was performed following the standard protocol without IV contrast. COMPARISON:  CTA chest 02/26/2015 FINDINGS: Lower chest: Negative.  No pericardial or pleural effusion. Hepatobiliary: Trace perihepatic free fluid. Negative noncontrast liver. The  gallbladder is visible on series 2, image 29 and is partially inseparable from a large lobulated right abdominal mass. The mass epicenter is in the right abdominal mesentery and tracks from the inferior liver edge to the pelvic inlet encompassing 93 by 148 by 118 millimeters (AP by transverse by CC). The mass is inseparable from multiple bowel loops, and might be contiguous with a bowel lumen on series 2, image 41 (versus necrotic/cavitary changes within the mass. Small volume superimposed right gutter free fluid. Pancreas: Atrophied and difficult to delineate. Spleen:  Negative. Adrenals/Urinary Tract: Both adrenal glands are difficult to delineate. The kidneys are nonobstructed. No nephrolithiasis. Diminutive and unremarkable urinary bladder. Stomach/Bowel: There is a small volume of oral contrast in the stomach and a larger volume of oral contrast in distal small bowel loops throughout the lower abdomen and pelvis. These small bowel loops define the caudal extent of the large lobulated abdominal mass described earlier on series 2, image 47. The mass appears to be intraperitoneal. The right colon and hepatic flexure are poorly delineated. The transverse colon is redundant. There are no dilated bowel loops. No free intraperitoneal air identified. Vascular/Lymphatic: Aortoiliac calcified atherosclerosis. 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 However, there is also abnormal nodular soft tissue bordering the aorta which renders some of the aortic contours difficult to delineate. There is chronic ectasia of the descending thoracic aorta and aorta at the hiatus of 35 millimeters. Vascular patency is not evaluated in the absence of IV contrast. Retrocrural and porta hepatis lymphadenopathy. Some of the individual nodes are difficult to delineate, but enlarged nodes are at least 11-17 millimeters short axis. Reproductive: Surgically absent uterus. There may be small ovaries along both pelvic sidewalls on series 2, image 49, uncertain. Other: Questionable trace pelvic free fluid on series 2, image 58. Musculoskeletal: No acute or suspicious osseous lesion. IMPRESSION: 1. Large, lobulated 15 centimeter mass located in the right abdomen is inseparable from large bowel as well as the gallbladder and inferior liver edge. The epicenter is within the right abdominal mesentery. A portion of the mass may be necrotic and/or communicate with the bowel lumen (series 2, image 41). In conjunction with lymphadenopathy (see #2) the  top differential considerations are Lymphoma (slightly favored), colon carcinoma, and less likely ovarian carcinoma. If the patient cannot receive CT IV contrast for better delineation then Abdomen MRI may be valuable. 2. Retrocrural, retroperitoneal, and right upper quadrant lymphadenopathy - some of which is inseparable from a juxta renal aortic aneurysm (see #3). 3. 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 Recommend vascular surgery consultation. 4. Small volume free fluid. No definite free air. No bowel obstruction. Electronically Signed   By: Genevie Ann M.D.   On: 10/08/2018 06:41   Dg Chest 2 View  Result Date: 10/07/2018 CLINICAL DATA:  Lower extremity pain and swelling for months, worse over the past 4 days. EXAM: CHEST - 2 VIEW COMPARISON:  CT chest and PA and lateral chest 02/26/2015. FINDINGS: The lungs are emphysematous but clear. Heart size is normal. No pneumothorax or pleural effusion. Aortic atherosclerosis noted. No acute or focal bony abnormality. IMPRESSION: Emphysema without acute disease. Atherosclerosis. Electronically Signed   By: Inge Rise M.D.   On: 10/07/2018 21:42   Vas [place] Lower Extremity Venous (dvt)  Result Date: 10/08/2018  Lower Venous Study Indications: Pain.  Performing Technologist: Oliver Hum RVT  Examination Guidelines:  A complete evaluation includes B-mode imaging, spectral Doppler, color Doppler, and power Doppler as needed of all accessible portions of each vessel. Bilateral testing is considered an integral part of a complete examination. Limited examinations for reoccurring indications may be performed as noted.  +---------+---------------+---------+-----------+----------+-------+ RIGHT    CompressibilityPhasicitySpontaneityPropertiesSummary +---------+---------------+---------+-----------+----------+-------+ CFV      Full           Yes      Yes                           +---------+---------------+---------+-----------+----------+-------+ SFJ      Full                                                 +---------+---------------+---------+-----------+----------+-------+ FV Prox  Full                                                 +---------+---------------+---------+-----------+----------+-------+ FV Mid   Full                                                 +---------+---------------+---------+-----------+----------+-------+ FV DistalFull                                                 +---------+---------------+---------+-----------+----------+-------+ PFV      Full                                                 +---------+---------------+---------+-----------+----------+-------+ POP      Full           Yes      Yes                          +---------+---------------+---------+-----------+----------+-------+ PTV      Full                                                 +---------+---------------+---------+-----------+----------+-------+ PERO     Full                                                 +---------+---------------+---------+-----------+----------+-------+   +---------+---------------+---------+-----------+----------+-------+ LEFT     CompressibilityPhasicitySpontaneityPropertiesSummary +---------+---------------+---------+-----------+----------+-------+ CFV      Full           Yes      Yes                          +---------+---------------+---------+-----------+----------+-------+ SFJ      Full                                                 +---------+---------------+---------+-----------+----------+-------+  FV Prox  Full                                                 +---------+---------------+---------+-----------+----------+-------+ FV Mid   Full                                                 +---------+---------------+---------+-----------+----------+-------+ FV  DistalFull                                                 +---------+---------------+---------+-----------+----------+-------+ PFV      Full                                                 +---------+---------------+---------+-----------+----------+-------+ POP      Full           Yes      Yes                          +---------+---------------+---------+-----------+----------+-------+ PTV      Full                                                 +---------+---------------+---------+-----------+----------+-------+ PERO     Full                                                 +---------+---------------+---------+-----------+----------+-------+     Summary: Right: There is no evidence of deep vein thrombosis in the lower extremity. No cystic structure found in the popliteal fossa. Left: There is no evidence of deep vein thrombosis in the lower extremity. No cystic structure found in the popliteal fossa.  *See table(s) above for measurements and observations.    Preliminary     Ct Abdomen Pelvis Wo Contrast  Result Date: 10/08/2018 CLINICAL DATA:  85 year old female with palpable abdominal mass. Lower extremity swelling. EXAM: CT ABDOMEN AND PELVIS WITHOUT CONTRAST TECHNIQUE: Multidetector CT imaging of the abdomen and pelvis was performed following the standard protocol without IV contrast. COMPARISON:  CTA chest 02/26/2015 FINDINGS: Lower chest: Negative.  No pericardial or pleural effusion. Hepatobiliary: Trace perihepatic free fluid. Negative noncontrast liver. The gallbladder is visible on series 2, image 29 and is partially inseparable from a large lobulated right abdominal mass. The mass epicenter is in the right abdominal mesentery and tracks from the inferior liver edge to the pelvic inlet encompassing 93 by 148 by 118 millimeters (AP by transverse by CC). The mass is inseparable from multiple bowel loops, and might be contiguous with a bowel lumen on series 2, image 41  (versus necrotic/cavitary changes within the mass. Small volume superimposed right gutter free fluid. Pancreas: Atrophied and difficult to  delineate. Spleen: Negative. Adrenals/Urinary Tract: Both adrenal glands are difficult to delineate. The kidneys are nonobstructed. No nephrolithiasis. Diminutive and unremarkable urinary bladder. Stomach/Bowel: There is a small volume of oral contrast in the stomach and a larger volume of oral contrast in distal small bowel loops throughout the lower abdomen and pelvis. These small bowel loops define the caudal extent of the large lobulated abdominal mass described earlier on series 2, image 47. The mass appears to be intraperitoneal. The right colon and hepatic flexure are poorly delineated. The transverse colon is redundant. There are no dilated bowel loops. No free intraperitoneal air identified. Vascular/Lymphatic: Aortoiliac calcified atherosclerosis. 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 However, there is also abnormal nodular soft tissue bordering the aorta which renders some of the aortic contours difficult to delineate. There is chronic ectasia of the descending thoracic aorta and aorta at the hiatus of 35 millimeters. Vascular patency is not evaluated in the absence of IV contrast. Retrocrural and porta hepatis lymphadenopathy. Some of the individual nodes are difficult to delineate, but enlarged nodes are at least 11-17 millimeters short axis. Reproductive: Surgically absent uterus. There may be small ovaries along both pelvic sidewalls on series 2, image 49, uncertain. Other: Questionable trace pelvic free fluid on series 2, image 58. Musculoskeletal: No acute or suspicious osseous lesion. IMPRESSION: 1. Large, lobulated 15 centimeter mass located in the right abdomen is inseparable from large bowel as well as the gallbladder and inferior liver edge. The epicenter is within the right abdominal  mesentery. A portion of the mass may be necrotic and/or communicate with the bowel lumen (series 2, image 41). In conjunction with lymphadenopathy (see #2) the top differential considerations are Lymphoma (slightly favored), colon carcinoma, and less likely ovarian carcinoma. If the patient cannot receive CT IV contrast for better delineation then Abdomen MRI may be valuable. 2. Retrocrural, retroperitoneal, and right upper quadrant lymphadenopathy - some of which is inseparable from a juxta renal aortic aneurysm (see #3). 3. 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 Recommend vascular surgery consultation. 4. Small volume free fluid. No definite free air. No bowel obstruction. Electronically Signed   By: Genevie Ann M.D.   On: 10/08/2018 06:41   Dg Chest 2 View  Result Date: 10/07/2018 CLINICAL DATA:  Lower extremity pain and swelling for months, worse over the past 4 days. EXAM: CHEST - 2 VIEW COMPARISON:  CT chest and PA and lateral chest 02/26/2015. FINDINGS: The lungs are emphysematous but clear. Heart size is normal. No pneumothorax or pleural effusion. Aortic atherosclerosis noted. No acute or focal bony abnormality. IMPRESSION: Emphysema without acute disease. Atherosclerosis. Electronically Signed   By: Inge Rise M.D.   On: 10/07/2018 21:42   Vas [place] Lower Extremity Venous (dvt)  Result Date: 10/08/2018  Lower Venous Study Indications: Pain.  Performing Technologist: Oliver Hum RVT  Examination Guidelines: A complete evaluation includes B-mode imaging, spectral Doppler, color Doppler, and power Doppler as needed of all accessible portions of each vessel. Bilateral testing is considered an integral part of a complete examination. Limited examinations for reoccurring indications may be performed as noted.  +---------+---------------+---------+-----------+----------+-------+ RIGHT     CompressibilityPhasicitySpontaneityPropertiesSummary +---------+---------------+---------+-----------+----------+-------+ CFV      Full           Yes      Yes                          +---------+---------------+---------+-----------+----------+-------+  SFJ      Full                                                 +---------+---------------+---------+-----------+----------+-------+ FV Prox  Full                                                 +---------+---------------+---------+-----------+----------+-------+ FV Mid   Full                                                 +---------+---------------+---------+-----------+----------+-------+ FV DistalFull                                                 +---------+---------------+---------+-----------+----------+-------+ PFV      Full                                                 +---------+---------------+---------+-----------+----------+-------+ POP      Full           Yes      Yes                          +---------+---------------+---------+-----------+----------+-------+ PTV      Full                                                 +---------+---------------+---------+-----------+----------+-------+ PERO     Full                                                 +---------+---------------+---------+-----------+----------+-------+   +---------+---------------+---------+-----------+----------+-------+ LEFT     CompressibilityPhasicitySpontaneityPropertiesSummary +---------+---------------+---------+-----------+----------+-------+ CFV      Full           Yes      Yes                          +---------+---------------+---------+-----------+----------+-------+ SFJ      Full                                                 +---------+---------------+---------+-----------+----------+-------+ FV Prox  Full                                                  +---------+---------------+---------+-----------+----------+-------+  FV Mid   Full                                                 +---------+---------------+---------+-----------+----------+-------+ FV DistalFull                                                 +---------+---------------+---------+-----------+----------+-------+ PFV      Full                                                 +---------+---------------+---------+-----------+----------+-------+ POP      Full           Yes      Yes                          +---------+---------------+---------+-----------+----------+-------+ PTV      Full                                                 +---------+---------------+---------+-----------+----------+-------+ PERO     Full                                                 +---------+---------------+---------+-----------+----------+-------+     Summary: Right: There is no evidence of deep vein thrombosis in the lower extremity. No cystic structure found in the popliteal fossa. Left: There is no evidence of deep vein thrombosis in the lower extremity. No cystic structure found in the popliteal fossa.  *See table(s) above for measurements and observations.    Preliminary     Assessment and Plan: This is a pleasant 85 year old Caucasian female who has been admitted with lower extremity edema and cellulitis.  She was found to have a palpable abdominal mass.  Imaging is concerning for malignancy.  Differentials include lymphoma, colon cancer, ovarian cancer.  Agree with biopsy by interventional radiology.  Agree with checking tumor markers including a CEA, CA 125, CA 19-9, and AFP tomorrow morning with morning labs.  Recommend CT scan of the chest for complete staging.  Once we have the results of her biopsy, we can have a more informed discussion regarding treatment options.  Thank you for this referral.  We will continue to follow this patient with you.  Mikey Bussing, DNP, AGPCNP-BC, AOCNP  ADDENDUM: Hematology/Oncology Attending: The patient is seen and examined today.  I agree with the above note.  This is a very pleasant 85 years old white female who presented to the emergency department for evaluation of generalized weakness as well as swelling of the lower extremities.  During her examination she was found to have renal insufficiency.  The patient underwent CT scan of the abdomen and pelvis which showed a large lobulated 15.0 cm mass located in the right abdomen and is inseparable from the large bowel as well  as the gallbladder and inferior liver edge with the epicenter within the right abdominal mesentery.  The portion of the mass may be necrotic and/or communicate with the bowel lumen.  There was also lymphadenopathy in the retrocrural, retroperitoneum and right upper quadrant.  These findings are suspicious for lymphoma, colon cancer or less likely ovarian carcinoma.  The patient has a history of hysterectomy many years ago without oophorectomy. She mentioned weight loss of around 20 pounds in the last 2 years.  She denied having any nausea, vomiting, abdominal pain, diarrhea or constipation.  She has no fever or chills.  She noticed the mass in the abdomen for a while but she was not seeking any medical attention. I had a lengthy discussion with the patient today about her current condition and the scan finding.  I agree with the current work-up including the tumor markers as well as proceeding with CT of the chest to complete the staging work-up.  The patient will need CT-guided core biopsy of the large abdominal mass for tissue diagnosis. Depending on the biopsy results and the staging work-up I will provide further recommendation for management of her condition. The patient is in agreement with the current plan. Thank you for the consultation I will continue to follow up the patient with you with more recommendation as more findings become  available. Disclaimer: This note was dictated with voice recognition software. Similar sounding words can inadvertently be transcribed and may be missed upon review. Eilleen Kempf, MD

## 2018-10-08 NOTE — Consult Note (Signed)
[place] Nurse wound consult note Reason for Consult: Bilateral LEs, L>R with cellulitis, chronic venous insufficiency Wound type:Venous insufficiency Pressure Injury POA N/A Measurement:N/A Wound bed:N/A Drainage (amount, consistency, odor) Serous weeping improving overnight with elevation Periwound: surface lesions on anterior LEs  Dressing procedure/placement/frequency: [employer] to initiate Unna's Boots twice weekly and maintain while in house. POC discussed with Dr. Jenene Slicker.  If you agree, please order [facility] to continue therapy, patient to follow up with her PCP or with the outpatient [facility] of her choosing.   [facility] nursing team will not follow, but will remain available to this patient, the nursing and medical teams.  Please re-consult if needed. Thanks, Maudie Flakes, MSN, RN, [place], Arther Abbott  Pager# [phone]

## 2018-10-08 NOTE — Progress Notes (Signed)
CRITICAL VALUE STICKER  CRITICAL VALUE: MRSA PCR (+)  DATE & TIME NOTIFIED: 10/08/2018  0502  RESPONSE: standing orders initiated

## 2018-10-08 NOTE — ED Notes (Addendum)
ED TO INPATIENT HANDOFF REPORT  ED Nurse Name and Phone #: [place] [facility] [phone]  S Name/Age/Gender Jonathon Resides 85 y.o. female Room/Bed: WA05/WA05  Code Status   Code Status: Prior  Home/SNF/Other Home Patient oriented to: self, place, time and situation Is this baseline? Yes   Triage Complete: Triage complete  Chief Complaint Leg Infection  Triage Note Pt BIB EMS from home. Pt reports leg weeping both lower extremities, more so on R leg. Pt reports this has been going on for months; swelling and leaking has increased last 4 days. Bruising to bilateral lower extremities. Pt has hx COPD.      Allergies Allergies  Allergen Reactions  . Penicillins Other (See Comments)    Childhood allergy    Level of Care/Admitting Diagnosis ED Disposition    ED Disposition Condition Comment   Admit  Hospital Area: [facility] [[id number]]  Level of Care: Telemetry [5]  Admit to tele based on following criteria: Other see comments  Comments: aki  Covid Evaluation: N/A  Diagnosis: Cellulitis [[id number]]  Admitting Physician: Toy Baker [[id number]]  Attending Physician: Toy Baker [[id number]]  PT Class (Do Not Modify): Observation [104]  PT Acc Code (Do Not Modify): Observation [10022]       B Medical/Surgery History Past Medical History:  Diagnosis Date  . COPD (chronic obstructive pulmonary disease) ([place])   . Hypertension   . Seasonal allergies    Past Surgical History:  Procedure Laterality Date  . APPENDECTOMY  1953  . CATARACT EXTRACTION Left 02/2008  . TOTAL ABDOMINAL HYSTERECTOMY  1975     A IV Location/Drains/Wounds Patient Lines/Drains/Airways Status   Active Line/Drains/Airways    None          Intake/Output Last 24 hours No intake or output data in the 24 hours ending 10/08/18 0035  Labs/Imaging Results for orders placed or performed during the hospital encounter of 10/07/18 (from the past 48 hour(s))  CBC with Differential      Status: Abnormal   Collection Time: 10/07/18  8:44 PM  Result Value Ref Range   WBC 15.0 (H) 4.0 - 10.5 K/uL   RBC 4.33 3.87 - 5.11 MIL/uL   Hemoglobin 11.3 (L) 12.0 - 15.0 g/dL   HCT 37.4 36.0 - 46.0 %   MCV 86.4 80.0 - 100.0 fL   MCH 26.1 26.0 - 34.0 pg   MCHC 30.2 30.0 - 36.0 g/dL   RDW 15.6 (H) 11.5 - 15.5 %   Platelets 287 150 - 400 K/uL   nRBC 0.0 0.0 - 0.2 %   Neutrophils Relative % 90 %   Neutro Abs 13.6 (H) 1.7 - 7.7 K/uL   Lymphocytes Relative 4 %   Lymphs Abs 0.6 (L) 0.7 - 4.0 K/uL   Monocytes Relative 5 %   Monocytes Absolute 0.7 0.1 - 1.0 K/uL   Eosinophils Relative 0 %   Eosinophils Absolute 0.0 0.0 - 0.5 K/uL   Basophils Relative 0 %   Basophils Absolute 0.0 0.0 - 0.1 K/uL   Immature Granulocytes 1 %   Abs Immature Granulocytes 0.09 (H) 0.00 - 0.07 K/uL    Comment: Performed at [facility], [place] [address], [place], [place] [address]  Comprehensive metabolic panel     Status: Abnormal   Collection Time: 10/07/18  8:44 PM  Result Value Ref Range   Sodium 135 135 - 145 mmol/L   Potassium 5.0 3.5 - 5.1 mmol/L   Chloride 99 98 - 111 mmol/L  CO2 25 22 - 32 mmol/L   Glucose, Bld 105 (H) 70 - 99 mg/dL   BUN 54 (H) 8 - 23 mg/dL   Creatinine, Ser 2.05 (H) 0.44 - 1.00 mg/dL   Calcium 8.7 (L) 8.9 - 10.3 mg/dL   Total Protein 6.0 (L) 6.5 - 8.1 g/dL   Albumin 2.9 (L) 3.5 - 5.0 g/dL   AST 19 15 - 41 U/L   ALT 10 0 - 44 U/L   Alkaline Phosphatase 79 38 - 126 U/L   Total Bilirubin 0.4 0.3 - 1.2 mg/dL   GFR calc non Af Amer 22 (L) >60 mL/min   GFR calc Af Amer 26 (L) >60 mL/min   Anion gap 11 5 - 15    Comment: Performed at [facility], [place] [address], [place], [place] [address]  Brain natriuretic peptide     Status: Abnormal   Collection Time: 10/07/18  8:44 PM  Result Value Ref Range   B Natriuretic Peptide 219.9 (H) 0.0 - 100.0 pg/mL    Comment: Performed at [facility], [place] [address]  [place], [place] [address]  Troponin I - ONCE - STAT     Status: None   Collection Time: 10/07/18  8:44 PM  Result Value Ref Range   Troponin I <0.03 <0.03 ng/mL    Comment: Performed at [facility], [place] [address] [place], [place] [address]  SARS Coronavirus 2 (CEPHEID - Performed in [place] hospital lab), Hosp Order     Status: None   Collection Time: 10/07/18  8:59 PM  Result Value Ref Range   SARS Coronavirus 2 NEGATIVE NEGATIVE    Comment: (NOTE) If result is NEGATIVE SARS-CoV-2 target nucleic acids are NOT DETECTED. The SARS-CoV-2 RNA is generally detectable in upper and lower  respiratory specimens during the acute phase of infection. The lowest  concentration of SARS-CoV-2 viral copies this assay can detect is 250  copies / mL. A negative result does not preclude SARS-CoV-2 infection  and should not be used as the sole basis for treatment or other  patient management decisions.  A negative result may occur with  improper specimen collection / handling, submission of specimen other  than nasopharyngeal swab, presence of viral mutation(s) within the  areas targeted by this assay, and inadequate number of viral copies  (<250 copies / mL). A negative result must be combined with clinical  observations, patient history, and epidemiological information. If result is POSITIVE SARS-CoV-2 target nucleic acids are DETECTED. The SARS-CoV-2 RNA is generally detectable in upper and lower  respiratory specimens dur ing the acute phase of infection.  Positive  results are indicative of active infection with SARS-CoV-2.  Clinical  correlation with patient history and other diagnostic information is  necessary to determine patient infection status.  Positive results do  not rule out bacterial infection or co-infection with other viruses. If result is PRESUMPTIVE POSTIVE SARS-CoV-2 nucleic acids MAY BE PRESENT.   A presumptive positive result was obtained on the submitted  specimen  and confirmed on repeat testing.  While 2019 novel coronavirus  (SARS-CoV-2) nucleic acids may be present in the submitted sample  additional confirmatory testing may be necessary for epidemiological  and / or clinical management purposes  to differentiate between  SARS-CoV-2 and other Sarbecovirus currently known to infect humans.  If clinically indicated additional testing with an alternate test  methodology [phone]) is advised. The SARS-CoV-2 RNA is generally  detectable in upper and lower  respiratory sp ecimens during the acute  phase of infection. The expected result is Negative. Fact Sheet for Patients:  StrictlyIdeas.no Fact Sheet for Healthcare Providers: BankingDealers.co.za This test is not yet approved or cleared by the [place] FDA and has been authorized for detection and/or diagnosis of SARS-CoV-2 by FDA under an Emergency Use Authorization (EUA).  This EUA will remain in effect (meaning this test can be used) for the duration of the COVID-19 declaration under Section 564(b)(1) of the Act, 21 U.S.C. section 360bbb-3(b)(1), unless the authorization is terminated or revoked sooner. Performed at [facility], [place] [address] [place], [place] [address]    Dg Chest 2 View  Result Date: 10/07/2018 CLINICAL DATA:  Lower extremity pain and swelling for months, worse over the past 4 days. EXAM: CHEST - 2 VIEW COMPARISON:  CT chest and PA and lateral chest 02/26/2015. FINDINGS: The lungs are emphysematous but clear. Heart size is normal. No pneumothorax or pleural effusion. Aortic atherosclerosis noted. No acute or focal bony abnormality. IMPRESSION: Emphysema without acute disease. Atherosclerosis. Electronically Signed   By: Inge Rise M.D.   On: 10/07/2018 21:42    Pending Labs Unresulted Labs (From admission, onward)    Start     Ordered   10/08/18 0031  Magnesium  Add-on,   R      10/08/18 0030   10/07/18 2316  Urinalysis, Routine w reflex microscopic  Once,   R     10/07/18 2315   10/07/18 2314  Creatinine, urine, random  Once,   R     10/07/18 2313   10/07/18 2314  Sodium, urine, random  Once,   R     10/07/18 2313   Signed and Held  Prealbumin  Tomorrow morning,   R     Signed and Held          Vitals/Pain Today's Vitals   10/07/18 2312 10/07/18 2330 10/08/18 0000 10/08/18 0030  BP:  (!) 134/97 (!) 115/93 113/89  Pulse:  90  80  Resp:  (!) 23 (!) 22 20  Temp:      TempSrc:      SpO2:  99%  98%  Weight: 47.6 kg     Height: _0  (1.575 m)       Isolation Precautions No active isolations  Medications Medications  doxycycline (VIBRA-TABS) tablet 100 mg (100 mg Oral Given 10/07/18 2227)    Mobility Walks with assistance Low fall risk   Focused Assessments Renal Assessment Handoff:     R Recommendations: See Admitting Provider Note  Report given to: Myriam Jacobson RN  Additional Notes:

## 2018-10-08 NOTE — ED Notes (Signed)
Attempted to obtain urine sample via purewick but the purewick didn't function properly. Will attempt via I/O or ambulating to RR in a few minutes.

## 2018-10-08 NOTE — TOC Initial Note (Signed)
Transition of Care [facility]) - Initial/Assessment Note    Patient Details  Name: Jasmine Horton MRN: [id number] Date of Birth: 1934/03/28  Transition of Care [facility] CM/SW Contact:    Dessa Phi, RN Phone Number: 10/08/2018, 3:16 PM  Clinical Narrative:Patient recc for SNF-she declines she prefers HHC-chose [facility]-rep Santiago Glad following-recc HHRN/PT.Left message w/son Charlie Pitter [phone].                   Expected Discharge Plan: [place] Barriers to Discharge: No Barriers Identified   Patient Goals and CMS Choice Patient states their goals for this hospitalization and ongoing recovery are:: go home CMS Medicare.gov Compare Post Acute Care list provided to:: Patient    Expected Discharge Plan and Services Expected Discharge Plan: [place]   Discharge Planning Services: CM Consult   Living arrangements for the past 2 months: Single Family Home                           HH Arranged: RN, PT [facility] Agency: [place] ([place]) Date HH Agency Contacted: 10/08/18 Time [place]: [id number] Representative spoke with at [place]: Santiago Glad  Prior Living Arrangements/Services Living arrangements for the past 2 months: [place] Lives with:: Adult Children Patient language and need for interpreter reviewed:: Yes Do you feel safe going back to the place where you live?: Yes      Need for Family Participation in Patient Care: No (Comment) Care giver support system in place?: Yes (comment)   Criminal Activity/Legal Involvement Pertinent to Current Situation/Hospitalization: No - Comment as needed  Activities of Daily Living Home Assistive Devices/Equipment: Dentures (specify type), Eyeglasses(upper and lower dentures) ADL Screening (condition at time of admission) Patient's cognitive ability adequate to safely complete daily activities?: Yes Is the patient deaf or have difficulty hearing?: No Does the patient have  difficulty seeing, even when wearing glasses/contacts?: No Does the patient have difficulty concentrating, remembering, or making decisions?: No Patient able to express need for assistance with ADLs?: Yes Does the patient have difficulty dressing or bathing?: No Independently performs ADLs?: Yes (appropriate for developmental age) Does the patient have difficulty walking or climbing stairs?: Yes Weakness of Legs: Both Weakness of Arms/Hands: None  Permission Sought/Granted Permission sought to share information with : Case Manager Permission granted to share information with : Yes, Verbal Permission Granted  Share Information with NAME: Charlie Pitter  Permission granted to share info w AGENCY: [facility]  Permission granted to share info w Relationship: son  Permission granted to share info w Contact Information: [phone]  Emotional Assessment Appearance:: Appears stated age Attitude/Demeanor/Rapport: Engaged Affect (typically observed): Accepting   Alcohol / Substance Use: Never Used Psych Involvement: No (comment)  Admission diagnosis:  Peripheral edema [R60.9] AKI (acute kidney injury) ([place]) [N17.9] Patient Active Problem List   Diagnosis Date Noted  . Venous stasis 10/08/2018  . Malnutrition ([place]) 10/08/2018  . Peripheral edema   . Cellulitis 10/07/2018  . AKI (acute kidney injury) ([place]) 10/07/2018  . Essential hypertension 02/27/2015  . Panlobular emphysema ([place])   . Pulmonary hypertension due to chronic obstructive pulmonary disease ([place])   . Hypoxia 02/26/2015   PCP:  Lawerance Cruel, MD Pharmacy:   [facility] [address], [place] [address] Phone: [phone] Fax: [phone]     Social Determinants of Health (SDOH) Interventions  Readmission Risk Interventions No flowsheet data found.

## 2018-10-08 NOTE — Consult Note (Addendum)
[facility] Surgery Consult Note  Jasmine Horton 11-25-1933  [id number].    Requesting MD: Nita Sells Chief Complaint:  Bilateral ankle swelling; redness and cool to touch bilateral also  Reason for Consult: Abdominal mass  HPI: Patient is an 85 year old female who lives at home.  She called her PCP yesterday complaining of swelling in her legs.  She noticed there is some weeping, both ankles were cool to the touch, and discolored.  She had no transportation was referred to the ED via EMS.  She related a similar story in the ED with her legs weeping more on the right than the left.  The lower extremity swelling has been ongoing for 2-49month, she was not sure.  The leakage has become progressively worse.  She also noted bruising in both lower extremities.    On exam the ED she had weeping fluid from both lower extremities they were cool to touch.  Capillary refill is normal she had good distal pulses.  +2 pitting edema up to the level of the mid shin.  She does have some venous stasis changes.  No significant pain or calf tenderness on palpation. Right sided palpable mass on abdominal exam.  Work-up in the ED/shows she is afebrile her vital signs are stable blood pressure slightly elevated.  Respiratory rate slightly elevated.  Labs obtained this a.m. shows a creatinine of 1.88, BUN of 53, LFTs are normal.  Albumin and total protein are both diminished.  BNP is 219.9.  Troponin is < 0.03 WBC 14.3, hemoglobin 10, hematocrit 33.6, platelets 254,000.  Glucose 99 TSH 3.8 urinalysis showed some red cells and a few white cells.  Urine sodium 26 and creatinine 137.46 MRSA is positive. Chest x-ray on admission shows emphysema without acute disease.  CT of the abdomen pelvis shows a large lobulated 113.7 x 11.8 x 9.3 cm mass located in the right abdomen, it is inseparable from the large bowel as well as the gallbladder and the inferior liver edge.  The epicenter was within the right abdominal  mesentery.  A portion of the mass was thought to be necrotic and are communicate with the bowel lumen.  Also some lymphadenopathy with a differential including lymphoma and colon carcinoma.  Is a small volume of free fluid no definite free air and no bowel obstruction.  Venous Doppler showed no evidence of DVT in either lower extremity echocardiogram is pending.  She was admitted by Medicine with cellulitis, palpable abdominal mass, acute kidney injury known COPD, venous stasis disease, malnutrition and essential hypertension.  We are asked to see.  Dr. SVerlon Auhas discussed this with Dr. CWindle Guard ROS: Review of Systems  Constitutional: Positive for weight loss (15 - 20 pound weight loss last 1-2 years). Negative for chills and fever.  HENT: Negative.   Eyes: Negative.   Respiratory: Negative for cough, hemoptysis, sputum production, shortness of breath and wheezing.   Cardiovascular: Positive for orthopnea (more comfortale with Head up on a cou;le pillows) and leg swelling. Negative for PND.  Gastrointestinal: Positive for diarrhea (occasional). Negative for abdominal pain, blood in stool, constipation, heartburn, melena, nausea and vomiting.       She has a hard palpable mass in her abdomen that is not painful and she has ignored it for undetermined time period. She has never had a colonoscopy.  Genitourinary: Negative.   Musculoskeletal: Negative.        She says she doesn't walk much now due to her leg swelling and serous drainage from her  legs  Skin:       Very thin, dry skin Both lower legs had blebs with some cellulitis just above the ankle, medial sides, both ankles.  Right now cellulitis seems more severe on the left lower leg than the right.  She has +1 edema both lower legs  Neurological: Negative.   Endo/Heme/Allergies: Negative for environmental allergies and polydipsia. Bruises/bleeds easily.  Psychiatric/Behavioral: Positive for memory loss (she says she has some decreased  memorty).    Family History  Problem Relation Age of Onset  . Dementia Mother   . Emphysema Father   . Breast cancer Other   . CAD Neg Hx   . Diabetes Neg Hx   . Stroke Neg Hx     Past Medical History:  Diagnosis Date  . COPD (chronic obstructive pulmonary disease) ([place])   . Hypertension   . Seasonal allergies     Past Surgical History:  Procedure Laterality Date  . APPENDECTOMY  1953  . CATARACT EXTRACTION Left 02/2008  . TOTAL ABDOMINAL HYSTERECTOMY  1975    Social History:  reports that she quit smoking about 4 years ago. Her smoking use included cigarettes. She has never used smokeless tobacco. She reports previous alcohol use. She reports that she does not use drugs. Tobacco:  30 years ~<1PPD; she worked at [employer] ETOH:  None Drugs:   Son lives with her since his second divorce.     Allergies:  Allergies  Allergen Reactions  . Penicillins Other (See Comments)    Childhood allergy    Medications Prior to Admission  Medication Sig Dispense Refill  . albuterol (PROVENTIL HFA;VENTOLIN HFA) 108 (90 BASE) MCG/ACT inhaler Inhale 2 puffs into the lungs every 6 (six) hours as needed for wheezing or shortness of breath. 1 Inhaler 2  . hydrochlorothiazide (HYDRODIURIL) 25 MG tablet Take 1 tablet (25 mg total) by mouth daily. (Patient not taking: Reported on 10/07/2018) 30 tablet 1  . tiotropium (SPIRIVA) 18 MCG inhalation capsule Place 1 capsule (18 mcg total) into inhaler and inhale daily. (Patient not taking: Reported on 10/07/2018) 30 capsule 12    Blood pressure (!) 151/80, pulse 74, temperature 98.1 F (36.7 C), temperature source Oral, resp. rate 16, height _0  (1.575 m), weight 47.6 kg, SpO2 99 %. Physical Exam: Physical Exam  Results for orders placed or performed during the hospital encounter of 10/07/18 (from the past 48 hour(s))  CBC with Differential     Status: Abnormal   Collection Time: 10/07/18  8:44 PM  Result Value Ref Range   WBC 15.0 (H) 4.0 -  10.5 K/uL   RBC 4.33 3.87 - 5.11 MIL/uL   Hemoglobin 11.3 (L) 12.0 - 15.0 g/dL   HCT 37.4 36.0 - 46.0 %   MCV 86.4 80.0 - 100.0 fL   MCH 26.1 26.0 - 34.0 pg   MCHC 30.2 30.0 - 36.0 g/dL   RDW 15.6 (H) 11.5 - 15.5 %   Platelets 287 150 - 400 K/uL   nRBC 0.0 0.0 - 0.2 %   Neutrophils Relative % 90 %   Neutro Abs 13.6 (H) 1.7 - 7.7 K/uL   Lymphocytes Relative 4 %   Lymphs Abs 0.6 (L) 0.7 - 4.0 K/uL   Monocytes Relative 5 %   Monocytes Absolute 0.7 0.1 - 1.0 K/uL   Eosinophils Relative 0 %   Eosinophils Absolute 0.0 0.0 - 0.5 K/uL   Basophils Relative 0 %   Basophils Absolute 0.0 0.0 - 0.1 K/uL  Immature Granulocytes 1 %   Abs Immature Granulocytes 0.09 (H) 0.00 - 0.07 K/uL    Comment: Performed at [facility], [place] [address], [place], [place] [address]  Comprehensive metabolic panel     Status: Abnormal   Collection Time: 10/07/18  8:44 PM  Result Value Ref Range   Sodium 135 135 - 145 mmol/L   Potassium 5.0 3.5 - 5.1 mmol/L   Chloride 99 98 - 111 mmol/L   CO2 25 22 - 32 mmol/L   Glucose, Bld 105 (H) 70 - 99 mg/dL   BUN 54 (H) 8 - 23 mg/dL   Creatinine, Ser 2.05 (H) 0.44 - 1.00 mg/dL   Calcium 8.7 (L) 8.9 - 10.3 mg/dL   Total Protein 6.0 (L) 6.5 - 8.1 g/dL   Albumin 2.9 (L) 3.5 - 5.0 g/dL   AST 19 15 - 41 U/L   ALT 10 0 - 44 U/L   Alkaline Phosphatase 79 38 - 126 U/L   Total Bilirubin 0.4 0.3 - 1.2 mg/dL   GFR calc non Af Amer 22 (L) >60 mL/min   GFR calc Af Amer 26 (L) >60 mL/min   Anion gap 11 5 - 15    Comment: Performed at [facility], [place] [address] [place], [place] [address]  Brain natriuretic peptide     Status: Abnormal   Collection Time: 10/07/18  8:44 PM  Result Value Ref Range   B Natriuretic Peptide 219.9 (H) 0.0 - 100.0 pg/mL    Comment: Performed at [facility], [place] [address] [place], [place] [address]  Troponin I - ONCE - STAT     Status: None   Collection Time: 10/07/18  8:44 PM  Result  Value Ref Range   Troponin I <0.03 <0.03 ng/mL    Comment: Performed at [facility], [place] [address] [place], [place] [address]  SARS Coronavirus 2 (CEPHEID - Performed in [place] hospital lab), Hosp Order     Status: None   Collection Time: 10/07/18  8:59 PM  Result Value Ref Range   SARS Coronavirus 2 NEGATIVE NEGATIVE    Comment: (NOTE) If result is NEGATIVE SARS-CoV-2 target nucleic acids are NOT DETECTED. The SARS-CoV-2 RNA is generally detectable in upper and lower  respiratory specimens during the acute phase of infection. The lowest  concentration of SARS-CoV-2 viral copies this assay can detect is 250  copies / mL. A negative result does not preclude SARS-CoV-2 infection  and should not be used as the sole basis for treatment or other  patient management decisions.  A negative result may occur with  improper specimen collection / handling, submission of specimen other  than nasopharyngeal swab, presence of viral mutation(s) within the  areas targeted by this assay, and inadequate number of viral copies  (<250 copies / mL). A negative result must be combined with clinical  observations, patient history, and epidemiological information. If result is POSITIVE SARS-CoV-2 target nucleic acids are DETECTED. The SARS-CoV-2 RNA is generally detectable in upper and lower  respiratory specimens dur ing the acute phase of infection.  Positive  results are indicative of active infection with SARS-CoV-2.  Clinical  correlation with patient history and other diagnostic information is  necessary to determine patient infection status.  Positive results do  not rule out bacterial infection or co-infection with other viruses. If result is PRESUMPTIVE POSTIVE SARS-CoV-2 nucleic acids MAY BE PRESENT.   A presumptive positive result was obtained on the  submitted specimen  and confirmed on repeat testing.  While 2019 novel coronavirus  (SARS-CoV-2) nucleic acids may be  present in the submitted sample  additional confirmatory testing may be necessary for epidemiological  and / or clinical management purposes  to differentiate between  SARS-CoV-2 and other Sarbecovirus currently known to infect humans.  If clinically indicated additional testing with an alternate test  methodology [phone]) is advised. The SARS-CoV-2 RNA is generally  detectable in upper and lower respiratory sp ecimens during the acute  phase of infection. The expected result is Negative. Fact Sheet for Patients:  StrictlyIdeas.no Fact Sheet for Healthcare Providers: BankingDealers.co.za This test is not yet approved or cleared by the [place] FDA and has been authorized for detection and/or diagnosis of SARS-CoV-2 by FDA under an Emergency Use Authorization (EUA).  This EUA will remain in effect (meaning this test can be used) for the duration of the COVID-19 declaration under Section 564(b)(1) of the Act, 21 U.S.C. section 360bbb-3(b)(1), unless the authorization is terminated or revoked sooner. Performed at [facility], [place] [address] [place], [place] [address]   Creatinine, urine, random     Status: None   Collection Time: 10/07/18 11:14 PM  Result Value Ref Range   Creatinine, Urine 137.46 mg/dL    Comment: Performed at [facility], [place] [address], [place], [place] [address]  Sodium, urine, random     Status: None   Collection Time: 10/07/18 11:14 PM  Result Value Ref Range   Sodium, Ur 26 mmol/L    Comment: Performed at [facility], [place] [address], [place], [place] [address]  Urinalysis, Routine w reflex microscopic     Status: Abnormal   Collection Time: 10/07/18 11:14 PM  Result Value Ref Range   Color, Urine YELLOW YELLOW   APPearance HAZY (A) CLEAR   Specific Gravity, Urine 1.016 1.005 - 1.030   pH 5.0 5.0 - 8.0   Glucose, UA NEGATIVE NEGATIVE mg/dL   Hgb  urine dipstick MODERATE (A) NEGATIVE   Bilirubin Urine NEGATIVE NEGATIVE   Ketones, ur NEGATIVE NEGATIVE mg/dL   Protein, ur 100 (A) NEGATIVE mg/dL   Nitrite NEGATIVE NEGATIVE   Leukocytes,Ua NEGATIVE NEGATIVE   RBC / HPF 21-50 0 - 5 RBC/hpf   WBC, UA 0-5 0 - 5 WBC/hpf   Bacteria, UA RARE (A) NONE SEEN   Squamous Epithelial / LPF 0-5 0 - 5   Mucus PRESENT    Hyaline Casts, UA PRESENT    Amorphous Crystal PRESENT     Comment: Performed at [facility], [place] [address], [place], [place] [address]  Magnesium     Status: None   Collection Time: 10/08/18  1:00 AM  Result Value Ref Range   Magnesium 2.0 1.7 - 2.4 mg/dL    Comment: Performed at [facility], [place] [address] [place], [place] [address]  MRSA PCR Screening     Status: Abnormal   Collection Time: 10/08/18  2:43 AM  Result Value Ref Range   MRSA by PCR POSITIVE (A) NEGATIVE    Comment:        The GeneXpert MRSA Assay (FDA approved for NASAL specimens only), is one component of a comprehensive MRSA colonization surveillance program. It is not intended to diagnose MRSA infection nor to guide or monitor treatment for MRSA infections. CRITICAL RESULT CALLED TO, READ BACK BY AND VERIFIED WITH: RN Doristine Johns AT 6599 10/08/18 CRUICKSHANK A Performed at [facility], [place] [address] [place], [place] [address]   Magnesium     Status: None   Collection Time: 10/08/18  4:37 AM  Result Value Ref Range   Magnesium 1.9 1.7 - 2.4 mg/dL    Comment: Performed at [facility], [place] [address], [place], [place] [address]  Phosphorus     Status: None   Collection Time: 10/08/18  4:37 AM  Result Value Ref Range   Phosphorus 3.0 2.5 - 4.6 mg/dL    Comment: Performed at [facility], [place] [address] [place], [place] [address]  TSH     Status: None   Collection Time: 10/08/18  4:37 AM  Result Value Ref Range   TSH 3.832 0.350 - 4.500 uIU/mL     Comment: Performed by a 3rd Generation assay with a functional sensitivity of <=0.01 uIU/mL. Performed at [facility], [place] [address] [place], [place] [address]   Comprehensive metabolic panel     Status: Abnormal   Collection Time: 10/08/18  4:37 AM  Result Value Ref Range   Sodium 135 135 - 145 mmol/L   Potassium 4.3 3.5 - 5.1 mmol/L   Chloride 100 98 - 111 mmol/L   CO2 25 22 - 32 mmol/L   Glucose, Bld 99 70 - 99 mg/dL   BUN 53 (H) 8 - 23 mg/dL   Creatinine, Ser 1.88 (H) 0.44 - 1.00 mg/dL   Calcium 8.4 (L) 8.9 - 10.3 mg/dL   Total Protein 5.4 (L) 6.5 - 8.1 g/dL   Albumin 2.6 (L) 3.5 - 5.0 g/dL   AST 15 15 - 41 U/L   ALT 10 0 - 44 U/L   Alkaline Phosphatase 75 38 - 126 U/L   Total Bilirubin 0.3 0.3 - 1.2 mg/dL   GFR calc non Af Amer 24 (L) >60 mL/min   GFR calc Af Amer 28 (L) >60 mL/min   Anion gap 10 5 - 15    Comment: Performed at [facility], [place] [address] [place], [place] [address]  CBC     Status: Abnormal   Collection Time: 10/08/18  4:37 AM  Result Value Ref Range   WBC 14.3 (H) 4.0 - 10.5 K/uL   RBC 3.86 (L) 3.87 - 5.11 MIL/uL   Hemoglobin 10.0 (L) 12.0 - 15.0 g/dL   HCT 33.6 (L) 36.0 - 46.0 %   MCV 87.0 80.0 - 100.0 fL   MCH 25.9 (L) 26.0 - 34.0 pg   MCHC 29.8 (L) 30.0 - 36.0 g/dL   RDW 15.8 (H) 11.5 - 15.5 %   Platelets 254 150 - 400 K/uL   nRBC 0.0 0.0 - 0.2 %    Comment: Performed at [facility], [place] [address], [place], [place] [address]  Prealbumin     Status: Abnormal   Collection Time: 10/08/18  7:55 AM  Result Value Ref Range   Prealbumin 5.2 (L) 18 - 38 mg/dL    Comment: Performed at [facility], [place] [address], [place], [place] [address]   Ct Abdomen Pelvis Wo Contrast  Result Date: 10/08/2018 CLINICAL DATA:  85 year old female with palpable abdominal mass. Lower extremity swelling. EXAM: CT ABDOMEN AND PELVIS WITHOUT CONTRAST TECHNIQUE: Multidetector CT imaging of the  abdomen and pelvis was performed following the standard protocol without IV contrast. COMPARISON:  CTA chest 02/26/2015 FINDINGS: Lower chest: Negative.  No pericardial or pleural effusion. Hepatobiliary: Trace perihepatic free fluid. Negative noncontrast liver. The gallbladder is  visible on series 2, image 29 and is partially inseparable from a large lobulated right abdominal mass. The mass epicenter is in the right abdominal mesentery and tracks from the inferior liver edge to the pelvic inlet encompassing 93 by 148 by 118 millimeters (AP by transverse by CC). The mass is inseparable from multiple bowel loops, and might be contiguous with a bowel lumen on series 2, image 41 (versus necrotic/cavitary changes within the mass. Small volume superimposed right gutter free fluid. Pancreas: Atrophied and difficult to delineate. Spleen: Negative. Adrenals/Urinary Tract: Both adrenal glands are difficult to delineate. The kidneys are nonobstructed. No nephrolithiasis. Diminutive and unremarkable urinary bladder. Stomach/Bowel: There is a small volume of oral contrast in the stomach and a larger volume of oral contrast in distal small bowel loops throughout the lower abdomen and pelvis. These small bowel loops define the caudal extent of the large lobulated abdominal mass described earlier on series 2, image 47. The mass appears to be intraperitoneal. The right colon and hepatic flexure are poorly delineated. The transverse colon is redundant. There are no dilated bowel loops. No free intraperitoneal air identified. Vascular/Lymphatic: Aortoiliac calcified atherosclerosis. 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 However, there is also abnormal nodular soft tissue bordering the aorta which renders some of the aortic contours difficult to delineate. There is chronic ectasia of the descending thoracic aorta and aorta at the hiatus of 35 millimeters. Vascular  patency is not evaluated in the absence of IV contrast. Retrocrural and porta hepatis lymphadenopathy. Some of the individual nodes are difficult to delineate, but enlarged nodes are at least 11-17 millimeters short axis. Reproductive: Surgically absent uterus. There may be small ovaries along both pelvic sidewalls on series 2, image 49, uncertain. Other: Questionable trace pelvic free fluid on series 2, image 58. Musculoskeletal: No acute or suspicious osseous lesion. IMPRESSION: 1. Large, lobulated 15 centimeter mass located in the right abdomen is inseparable from large bowel as well as the gallbladder and inferior liver edge. The epicenter is within the right abdominal mesentery. A portion of the mass may be necrotic and/or communicate with the bowel lumen (series 2, image 41). In conjunction with lymphadenopathy (see #2) the top differential considerations are Lymphoma (slightly favored), colon carcinoma, and less likely ovarian carcinoma. If the patient cannot receive CT IV contrast for better delineation then Abdomen MRI may be valuable. 2. Retrocrural, retroperitoneal, and right upper quadrant lymphadenopathy - some of which is inseparable from a juxta renal aortic aneurysm (see #3). 3. 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 Recommend vascular surgery consultation. 4. Small volume free fluid. No definite free air. No bowel obstruction. Electronically Signed   By: Genevie Ann M.D.   On: 10/08/2018 06:41   Dg Chest 2 View  Result Date: 10/07/2018 CLINICAL DATA:  Lower extremity pain and swelling for months, worse over the past 4 days. EXAM: CHEST - 2 VIEW COMPARISON:  CT chest and PA and lateral chest 02/26/2015. FINDINGS: The lungs are emphysematous but clear. Heart size is normal. No pneumothorax or pleural effusion. Aortic atherosclerosis noted. No acute or focal bony abnormality. IMPRESSION: Emphysema without acute disease.  Atherosclerosis. Electronically Signed   By: Inge Rise M.D.   On: 10/07/2018 21:42   Vas [place] Lower Extremity Venous (dvt)  Result Date: 10/08/2018  Lower Venous Study Indications: Pain.  Performing Technologist: Oliver Hum RVT  Examination Guidelines: A complete  evaluation includes B-mode imaging, spectral Doppler, color Doppler, and power Doppler as needed of all accessible portions of each vessel. Bilateral testing is considered an integral part of a complete examination. Limited examinations for reoccurring indications may be performed as noted.  +---------+---------------+---------+-----------+----------+-------+ RIGHT    CompressibilityPhasicitySpontaneityPropertiesSummary +---------+---------------+---------+-----------+----------+-------+ CFV      Full           Yes      Yes                          +---------+---------------+---------+-----------+----------+-------+ SFJ      Full                                                 +---------+---------------+---------+-----------+----------+-------+ FV Prox  Full                                                 +---------+---------------+---------+-----------+----------+-------+ FV Mid   Full                                                 +---------+---------------+---------+-----------+----------+-------+ FV DistalFull                                                 +---------+---------------+---------+-----------+----------+-------+ PFV      Full                                                 +---------+---------------+---------+-----------+----------+-------+ POP      Full           Yes      Yes                          +---------+---------------+---------+-----------+----------+-------+ PTV      Full                                                 +---------+---------------+---------+-----------+----------+-------+ PERO     Full                                                  +---------+---------------+---------+-----------+----------+-------+   +---------+---------------+---------+-----------+----------+-------+ LEFT     CompressibilityPhasicitySpontaneityPropertiesSummary +---------+---------------+---------+-----------+----------+-------+ CFV      Full           Yes      Yes                          +---------+---------------+---------+-----------+----------+-------+ SFJ      Full                                                 +---------+---------------+---------+-----------+----------+-------+  FV Prox  Full                                                 +---------+---------------+---------+-----------+----------+-------+ FV Mid   Full                                                 +---------+---------------+---------+-----------+----------+-------+ FV DistalFull                                                 +---------+---------------+---------+-----------+----------+-------+ PFV      Full                                                 +---------+---------------+---------+-----------+----------+-------+ POP      Full           Yes      Yes                          +---------+---------------+---------+-----------+----------+-------+ PTV      Full                                                 +---------+---------------+---------+-----------+----------+-------+ PERO     Full                                                 +---------+---------------+---------+-----------+----------+-------+     Summary: Right: There is no evidence of deep vein thrombosis in the lower extremity. No cystic structure found in the popliteal fossa. Left: There is no evidence of deep vein thrombosis in the lower extremity. No cystic structure found in the popliteal fossa.  *See table(s) above for measurements and observations.    Preliminary    . sodium chloride 100 mL/hr at 10/08/18 1132  . doxycycline (VIBRAMYCIN) IV 100 mg  (10/08/18 1007)      Assessment/Plan COPD Mild venous stasis disease Negative for DVT both lower extremities   13.8 x 11.7 x 9.3 cm Right abdominal mass Bilateral lower extremity swelling AKI  FEN:  IIV fluids.NPO currently ID:  Doxycycline 5/11>> day 2 DVT: lovenox Follow up:  TBD POC: Charlie Pitter Son   [phone]  Ballard Russell Daughter [phone]       Plan:  Dr. Kae Heller discussed patient and reviewed CT with Dr. Verlon Au.  She recommended MRI of the abdomen, IR biopsy.  I have added tumor markers for tomorrow, along with a prealbumin.     [facility] Surgery 10/08/2018, 10:21 AM Pager: [phone] Consults: [phone]

## 2018-10-08 NOTE — Evaluation (Signed)
Occupational Therapy Evaluation Patient Details Name: Jasmine Horton MRN: [id number] DOB: 1933-09-26 Today's Date: 10/08/2018    History of Present Illness 85 year old female was admitted for legs weeping and feeling cool to touch.  PMH:  HTN, asthmatic bronchitis with possible COPD, CHF   Clinical Impression   Pt was admitted for the above. At baseline, she is independent with all basic ADLs and sponge bathes in the bathroom. She has been able to get up from a standard commode, but she reports it isn't easy. She lives with her son, who does IADLs.  He will not help with BADLs, so pt needs to be mod I. She needs min A for SPT at this time.  LB dressing limited by pain.  She may benefit from 3:1 over toilet, if she is agreeable.  Will follow in acute setting with supervision level goals.    Follow Up Recommendations  Home health OT vs SNF if pt cannot reach a set up level   Equipment Recommendations  3 in 1 bedside commode(possibly)    Recommendations for Other Services       Precautions / Restrictions Precautions Precautions: Fall Restrictions Weight Bearing Restrictions: No      Mobility Bed Mobility Overal bed mobility: Needs Assistance Bed Mobility: Supine to Sit;Sit to Supine     Supine to sit: Min guard Sit to supine: Min assist   General bed mobility comments: light assistance to get legs back into bed  Transfers Overall transfer level: Needs assistance Equipment used: 1 person hand held assist Transfers: Sit to/from [facility] Sit to Stand: Min assist Stand pivot transfers: Min assist       General transfer comment: light assistance to stand and stabilize    Balance                                           ADL either performed or assessed with clinical judgement   ADL Overall ADL's : Needs assistance/impaired Eating/Feeding: Independent   Grooming: Wash/dry hands;Set up;Sitting   Upper Body Bathing: Set up    Lower Body Bathing: Minimal assistance   Upper Body Dressing : Set up   Lower Body Dressing: Maximal assistance   Toilet Transfer: Minimal assistance;Stand-pivot;BSC;RW   Toileting- Clothing Manipulation and Hygiene: Minimal assistance;Sit to/from stand         General ADL Comments: used BSC next to bed.  Pt's legs are weeping a lot; she states this is relatively new. She has been able to do all self care prior to admission; legs are very painful      Vision         Perception     Praxis      Pertinent Vitals/Pain Pain Assessment: Faces Faces Pain Scale: Hurts even more Pain Location: bil feet/legs with weight bearing Pain Intervention(s): Limited activity within patient's tolerance;Monitored during session;Repositioned     Hand Dominance     Extremity/Trunk Assessment Upper Extremity Assessment Upper Extremity Assessment: Overall WFL for tasks assessed           Communication Communication Communication: No difficulties   Cognition Arousal/Alertness: Awake/alert Behavior During Therapy: WFL for tasks assessed/performed Overall Cognitive Status: Within Functional Limits for tasks assessed  General Comments       Exercises     Shoulder Instructions      Home Living Family/patient expects to be discharged to:: Private residence Living Arrangements: Children   Type of Home: House             Bathroom Shower/Tub: (sponge bathes)   Bathroom Toilet: Standard     Home Equipment: None   Additional Comments: lives with son      Prior Functioning/Environment Level of Independence: Independent        Comments: son does not help with any personal care; cooks and cleans        OT Problem List: Decreased strength;Decreased activity tolerance;Impaired balance (sitting and/or standing);Decreased knowledge of use of DME or AE;Pain      OT Treatment/Interventions: Self-care/ADL training;Energy  conservation;DME and/or AE instruction;Patient/family education;Balance training;Therapeutic activities    OT Goals(Current goals can be found in the care plan section) Acute Rehab OT Goals Patient Stated Goal: less pain; return to independence OT Goal Formulation: With patient Time For Goal Achievement: 10/22/18 Potential to Achieve Goals: Good ADL Goals Pt Will Transfer to Toilet: with supervision;ambulating;regular height toilet(vs 3:1 over commode) Pt Will Perform Toileting - Clothing Manipulation and hygiene: with modified independence;sit to/from stand Additional ADL Goal #1: pt will complete adl at set up/supervision level with AE as needed  OT Frequency: Min 2X/week   Barriers to D/C:            Co-evaluation              AM-PAC OT "6 Clicks" Daily Activity     Outcome Measure Help from another person eating meals?: None Help from another person taking care of personal grooming?: A Little Help from another person toileting, which includes using toliet, bedpan, or urinal?: A Little Help from another person bathing (including washing, rinsing, drying)?: A Little Help from another person to put on and taking off regular upper body clothing?: A Little Help from another person to put on and taking off regular lower body clothing?: A Lot 6 Click Score: 18   End of Session    Activity Tolerance: Patient tolerated treatment well Patient left: in bed;with call bell/phone within reach;with bed alarm set  OT Visit Diagnosis: Unsteadiness on feet (R26.81);Pain Pain - Right/Left: (bil) Pain - part of body: Leg                Time: 0034-9179 OT Time Calculation (min): 25 min Charges:  OT General Charges $OT Visit: 1 Visit OT Evaluation $OT Eval Low Complexity: 1 Low OT Treatments $Self Care/Home Management : 8-22 mins  Jasmine Horton, OTR/L Acute Rehabilitation Services [phone] [facility] pager [phone] office 10/08/2018  Jasmine Horton 10/08/2018, 9:57 AM

## 2018-10-08 NOTE — Evaluation (Signed)
Physical Therapy Evaluation Patient Details Name: Jasmine Horton MRN: [id number] DOB: Sep 09, 1933 Today's Date: 10/08/2018   History of Present Illness  85 year old female was admitted for leg weeping and feeling cool to touch. Incidential finding of 15 cm mass in abdomen, possible lymphoma vs colon carcinoma vs ovarian cancer. Pt undergoing testing, results pending. PMH:  HTN, asthmatic bronchitis with possible COPD, CHF  Clinical Impression   Pt presents with generalized weakness, difficulty performing bed mobility, LE wounds with associated pain in WB, tachypnea with heavy accessory respiratory muscle use on exertion, and decreased activity tolerance. Pt to benefit from acute PT to address deficits. Pt ambulated short room distance this session, pt reporting fatigue and LE pain limiting her from further mobility. PT recommending SNF vs HHPT, pending pt progress and course of hospitalization.  PT to progress mobility as tolerated, and will continue to follow acutely.   HR during ambulation: 90s bpm SpO2 during ambulation: 89% on RA, recovering to 95% with rest     Follow Up Recommendations SNF;Home health PT;Supervision/Assistance - 24 hour(pending pt progress, and hospitalization course)    Equipment Recommendations  Rolling walker with 5" wheels    Recommendations for Other Services       Precautions / Restrictions Precautions Precautions: Fall Restrictions Weight Bearing Restrictions: No      Mobility  Bed Mobility Overal bed mobility: Needs Assistance Bed Mobility: Supine to Sit;Sit to Supine     Supine to sit: Min guard Sit to supine: Min assist   General bed mobility comments: Min guard for supine to sit for safety, pt with use of bedrails and increased time. Min assist for sit to supine for LE lifting into bed, scooting pt up in bed with use of bed pad.   Transfers Overall transfer level: Needs assistance Equipment used: Rolling walker (2 wheeled) Transfers: Sit  to/from Stand Sit to Stand: Min assist Stand pivot transfers: Min assist       General transfer comment: min assist for power up and steadying upon standing. Pt does not use RW at home, but pt requesting RW this session for stability. Verbal cuing for hand placement when pt rising from bed.   Ambulation/Gait Ambulation/Gait assistance: Min guard Gait Distance (Feet): 15 Feet Assistive device: Rolling walker (2 wheeled) Gait Pattern/deviations: Step-through pattern;Decreased stride length;Trunk flexed Gait velocity: very decreased, guarded    General Gait Details: Min guard for safety. Pt with short-step, shuffling-like gait during ambulation. Verbal cuing for placement in RW.   Stairs            Wheelchair Mobility    Modified Rankin (Stroke Patients Only)       Balance Overall balance assessment: Needs assistance Sitting-balance support: No upper extremity supported;Feet supported Sitting balance-Leahy Scale: Fair Sitting balance - Comments: able to sit EOB without UE support, posterior leaning with LE movement Postural control: Posterior lean   Standing balance-Leahy Scale: Poor Standing balance comment: reliant on UE support in standing                             Pertinent Vitals/Pain Pain Assessment: Faces Faces Pain Scale: Hurts little more Pain Location: bil feet/legs with mobility  Pain Descriptors / Indicators: Sore;Discomfort;Grimacing Pain Intervention(s): Limited activity within patient's tolerance;Monitored during session;Repositioned    Home Living Family/patient expects to be discharged to:: Private residence Living Arrangements: Children Available Help at Discharge: Family;Available PRN/intermittently(Pt states she lives with her son, he works as  an Clinical biochemist) Type of Home: House(townhouse) Home Access: Level entry     Home Layout: Two level;1/2 bath on main level Home Equipment: None Additional Comments: lives with son     Prior Function Level of Independence: Independent         Comments: son does not help with any personal care, but pt's son drives her places because pt does not drive; cooks, cleans. Pt reports her son "doesn't deal with medical stuff" .      Hand Dominance   Dominant Hand: Right    Extremity/Trunk Assessment   Upper Extremity Assessment Upper Extremity Assessment: Defer to OT evaluation    Lower Extremity Assessment Lower Extremity Assessment: Generalized weakness(MMT bilaterally of hip flexion, hip abd/add, knee extension/flexion, PF, DF grossly 3/5 when screened sitting EOB )    Cervical / Trunk Assessment Cervical / Trunk Assessment: Normal  Communication   Communication: No difficulties  Cognition Arousal/Alertness: Awake/alert Behavior During Therapy: WFL for tasks assessed/performed Overall Cognitive Status: Within Functional Limits for tasks assessed                                        General Comments General comments (skin integrity, edema, etc.): Pt with bilateral LE weeping, wounds. Pt with sats at 89% on RA and tachypnea on exertion, recovered to 95% on RA with rest.     Exercises     Assessment/Plan    PT Assessment Patient needs continued PT services  PT Problem List Decreased strength;Decreased mobility;Decreased safety awareness;Decreased activity tolerance;Decreased balance;Decreased knowledge of use of DME;Pain;Cardiopulmonary status limiting activity       PT Treatment Interventions DME instruction;Functional mobility training;Balance training;Gait training;Stair training;Therapeutic exercise;Therapeutic activities;Patient/family education    PT Goals (Current goals can be found in the Care Plan section)  Acute Rehab PT Goals Patient Stated Goal: none stated with PT PT Goal Formulation: With patient Time For Goal Achievement: 10/22/18 Potential to Achieve Goals: Good    Frequency Min 3X/week   Barriers to discharge  Decreased caregiver support Per pt, pt's son assists her with household duties but PT unsure how much medical assist and care pt is receiving     Co-evaluation               AM-PAC PT "6 Clicks" Mobility  Outcome Measure Help needed turning from your back to your side while in a flat bed without using bedrails?: A Little Help needed moving from lying on your back to sitting on the side of a flat bed without using bedrails?: A Little Help needed moving to and from a bed to a chair (including a wheelchair)?: A Little Help needed standing up from a chair using your arms (e.g., wheelchair or bedside chair)?: A Little Help needed to walk in hospital room?: A Little Help needed climbing 3-5 steps with a railing? : A Lot 6 Click Score: 17    End of Session   Activity Tolerance: Patient limited by fatigue;Patient limited by pain Patient left: in bed;with call bell/phone within reach Nurse Communication: Mobility status PT Visit Diagnosis: Other abnormalities of gait and mobility (R26.89);Muscle weakness (generalized) (M62.81);Difficulty in walking, not elsewhere classified (R26.2)    Time: 2703-5009 PT Time Calculation (min) (ACUTE ONLY): 22 min   Charges:   PT Evaluation $PT Eval Low Complexity: 1 Low         Suhas Estis Conception Chancy, PT Acute Rehabilitation Services Pager [phone]  Office [phone]   Julien Girt 10/08/2018, 12:58 PM

## 2018-10-08 NOTE — Progress Notes (Signed)
D/w Dr. Watt Climes of Jasmine Horton who graciously coordinated with IR--looks like Biopsy will be in am Patient is to be npo post Midnight Dr. Watt Climes is on standby but can be available if prn  Verneita Griffes, MD [facility] 5:45 PM

## 2018-10-08 NOTE — Progress Notes (Signed)
[facility] PROGRESS NOTE  RAJA CAPUTI UDJ:[id number] DOB: 07-23-1933 DOA: 10/07/2018 PCP: Lawerance Cruel, MD   Narrative: 85 year old Caucasian female Known history of HLD HTN asthmatic bronchitis and possible diastolic heart failure Emphysema with prior tobacco use  Last seen in the hospital 02/28/2015 and discharged after complaining of vague dyspnea-CTs of the chest showed severe emphysema at that time she also had a nodular density in the left lower lobe of the lung at that time  She presents back to the hospital with cellulitis and weeping wounds in her lower extremity and has never been a big lady has always had frail build  A CT abdomen pelvis was undertaken because of abdominal mass palpated on exam and work-up ensued as below    A & Plan Lower extremity cellulitis Empiric coverage doxycycline, Unna boots-dorsalis pedis is bounding bilaterally her feet are not cold we will check her feet again probably in 3 to 5 days in the hospital or as an outpatient  Abdominal mass Received a call from Dr. Pascal Lux of IR-his concern is that this may be an intracolonic lymphoma--MRI has been canceled  gastroenterology -?  Need for colonoscopy-call placed to Dr. Watt Climes-  greatly appreciate general surgery in addition to oncology input Await CEA, CA 125, CA 1919 and AFP I had a frank discussion with the patient at the bedside-I explained to her that she might have cancer-I explained to her that I am not sure exactly what flavor-I also explained to her that she does not necessarily need to do anything about this if she does not think it would give her any improved quality of life---she will think on this  AKI BUN/creatinine down from 54/2 0.0-50 3/1.8-monitor LFTs normal -continue saline 100 cc/H  Malnutrition BMI 19, prealbumin 5.2-add supplements  ?  Vegetation Echocardiogram shows a small mobile vegetation She has an infection in her lower extremities but we will follow get  blood cultures x2 for completion sake Have asked cardiology to see patient to determine if they can do TEE   Full code, inpatient   Ruhama Lehew, MD  [facility] [email or web address] [email or web address] 7PM-7AM contact night coverage as above 10/08/2018, 11:16 AM  LOS: 0 days   Consultants:  Oncology  IR  Cardiology   Procedures: IMPRESSION: 1. Large, lobulated 15 centimeter mass located in the right abdomen is inseparable from large bowel as well as the gallbladder and inferior liver edge. The epicenter is within the right abdominal mesentery. A portion of the mass may be necrotic and/or communicate with the bowel lumen (series 2, image 41). In conjunction with lymphadenopathy (see #2) the top differential considerations are Lymphoma (slightly favored), colon carcinoma, and less likely ovarian carcinoma. If the patient cannot receive CT IV contrast for better delineation then Abdomen MRI may be valuable. 2. Retrocrural, retroperitoneal, and right upper quadrant lymphadenopathy - some of which is inseparable from a juxta renal aortic aneurysm (see #3). 3. 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 Recommend vascular surgery consultation. 4. Small volume free fluid. No definite free air. No bowel obstruction.  Antimicrobials:  None  Interval history/Subjective: Awake alert no distress no fever no chills states lower extremities are somewhat uncomfortable and swollen but they seem better than prior according to her    Objective:  Vitals:  Vitals:   10/08/18 0128 10/08/18 0452  BP: (!) 156/82 (!) 151/80  Pulse: 80 74  Resp: (!) 22 16  Temp: (!)  97.4 F (36.3 C) 98.1 F (36.7 C)  SpO2: 99% 99%    Exam:  External ocular movements intact Very frail cachectic by temporalis wasting no thrush Chest clinically clear no added sound no rales no rhonchi Abdomen soft however large mass  extending from iliac crest on right side upwards Neurologically intact   I have personally reviewed the following:  DATA   Labs:  BUN/creatinine down from 54/2 0.05-50 3/1.8  Prealbumin 5.2  Wbc->15--->14.3  Hemoglobin 11.3-->10.0  Echo shows vegetation?  Scheduled Meds: . Chlorhexidine Gluconate Cloth  6 each Topical Q0600  . guaiFENesin  600 mg Oral BID  . mupirocin ointment  1 application Nasal BID   Continuous Infusions: . sodium chloride    . doxycycline (VIBRAMYCIN) IV 100 mg (10/08/18 1007)    Active Problems:   Essential hypertension   Panlobular emphysema (HCC)   Cellulitis   AKI (acute kidney injury) ([place])   Venous stasis   Malnutrition (HCC)   Peripheral edema   LOS: 0 days

## 2018-10-08 NOTE — Progress Notes (Signed)
Bilateral lower extremity venous duplex has been completed. Preliminary results can be found in CV Proc through chart review.   10/08/18 9:52 AM Carlos Levering RVT

## 2018-10-09 ENCOUNTER — Inpatient Hospital Stay (HOSPITAL_COMMUNITY): Payer: Medicare Other

## 2018-10-09 ENCOUNTER — Encounter (HOSPITAL_COMMUNITY): Payer: Self-pay | Admitting: Internal Medicine

## 2018-10-09 DIAGNOSIS — C787 Secondary malignant neoplasm of liver and intrahepatic bile duct: Secondary | ICD-10-CM | POA: Diagnosis present

## 2018-10-09 DIAGNOSIS — E440 Moderate protein-calorie malnutrition: Secondary | ICD-10-CM

## 2018-10-09 DIAGNOSIS — I878 Other specified disorders of veins: Secondary | ICD-10-CM

## 2018-10-09 DIAGNOSIS — N179 Acute kidney failure, unspecified: Principal | ICD-10-CM

## 2018-10-09 DIAGNOSIS — I10 Essential (primary) hypertension: Secondary | ICD-10-CM

## 2018-10-09 DIAGNOSIS — C189 Malignant neoplasm of colon, unspecified: Secondary | ICD-10-CM | POA: Diagnosis present

## 2018-10-09 HISTORY — DX: Secondary malignant neoplasm of liver and intrahepatic bile duct: C78.7

## 2018-10-09 LAB — PROTIME-INR
INR: 1.1 (ref 0.8–1.2)
Prothrombin Time: 13.8 seconds (ref 11.4–15.2)

## 2018-10-09 LAB — PREALBUMIN: Prealbumin: <5 mg/dL — ABNORMAL LOW (ref 18–38)

## 2018-10-09 MED ORDER — LIDOCAINE HCL 1 % IJ SOLN
INTRAMUSCULAR | Status: AC
  Filled 2018-10-09: qty 20

## 2018-10-09 MED ORDER — MIDAZOLAM HCL 2 MG/2ML IJ SOLN
INTRAMUSCULAR | Status: AC
  Filled 2018-10-09: qty 4

## 2018-10-09 MED ORDER — ADULT MULTIVITAMIN W/MINERALS CH
1.0000 | ORAL_TABLET | Freq: Every day | ORAL | Status: DC
  Administered 2018-10-09 – 2018-10-10 (×2): 1 via ORAL
  Filled 2018-10-09 (×2): qty 1

## 2018-10-09 MED ORDER — ENSURE ENLIVE PO LIQD: 237.0000 mL | Freq: Two times a day (BID) | ORAL | Status: DC

## 2018-10-09 MED ORDER — MIDAZOLAM HCL 2 MG/2ML IJ SOLN
INTRAMUSCULAR | Status: AC | PRN
  Administered 2018-10-09: 1 mg via INTRAVENOUS

## 2018-10-09 MED ORDER — FENTANYL CITRATE (PF) 100 MCG/2ML IJ SOLN
INTRAMUSCULAR | Status: AC | PRN
  Administered 2018-10-09: 50 ug via INTRAVENOUS

## 2018-10-09 MED ORDER — FENTANYL CITRATE (PF) 100 MCG/2ML IJ SOLN
INTRAMUSCULAR | Status: AC
  Filled 2018-10-09: qty 2

## 2018-10-09 NOTE — Progress Notes (Signed)
PROGRESS NOTE    Jasmine Horton  IYM:[id number] DOB: [birth year] DOA: 10/07/2018 PCP: Lawerance Cruel, MD    Brief Narrative:  85 year old female with history of hyperlipidemia, hypertension and asthmatic bronchitis, previously smoker who presented to the hospital with cellulitis and weeping wounds on her lower extremities.  On ER evaluation, patient was found to have palpable abdominal mass.  CT scan also showed chest lymphadenopathy.  Assessment & Plan:   Principal Problem:   Cellulitis Active Problems:   Essential hypertension   Panlobular emphysema (HCC)   AKI (acute kidney injury) ([place])   Venous stasis   Malnutrition ([place])   Peripheral edema   Metastatic cancer to liver (HCC)  Bilateral Leg cellulitis: Acute on chronic exacerbation of bilateral venous stasis ulcers and open wounds.  Seen by wound care.  Will need ongoing wound care at home.  Continue Haematologist.  Mobilize. Doxycycline 100 mg twice a day for 7 days.  Metastatic cancer to liver: Patient was found to have malignant lesion on her liver, multiple lymphadenopathy and also with intrathoracic lymphadenopathies.  Probably metastatic cancer.  He status post biopsy of the liver lesion today. Followed by oncology. We will have outpatient follow-up.  Tumor markers are pending.  Abnormal aortic valve: with no known significance.  Patient does not have systemic symptoms of endocarditis.  Will order blood cultures today.  Moderate protein calorie malnutrition: seen by nutrition. Augment nutrition, add supplements.   COPD: stable.   AKI on CKD3: unknown base line. Will continue hydration and recheck in AM.  Essential hypertension: on HCTZ at home. Stable. Will resume on discharge.   DVT prophylaxis: SCDs Code Status: full code  Family Communication: none. Patient will talk to family  Disposition Plan: home. Anticipate tomorrow.    Consultants:   Oncology  surgery  Procedures:   IR perc biopsy liver lesion  , 10/09/2018  Antimicrobials:   None     Subjective: Patient was seen and examined at the bedside.  She went to the procedure.  Denied any complaints.  Denied any abdominal pain.  No nausea or vomiting.  She always has poor appetite.  Objective: Vitals:   10/09/18 1240 10/09/18 1245 10/09/18 1320 10/09/18 1500  BP: 124/73  109/78   Pulse: 79 77 82   Resp: _0 Temp:   98.4 F (36.9 C)   TempSrc:   Oral   SpO2: 100% 100% 95% 95%  Weight:      Height:        Intake/Output Summary (Last 24 hours) at 10/09/2018 1533 Last data filed at 10/09/2018 0600 Gross per 24 hour  Intake 2026.91 ml  Output 1200 ml  Net 826.91 ml   Filed Weights   10/07/18 2312  Weight: 47.6 kg    Examination:  General exam: Appears calm and comfortable , frail looking older lady. Respiratory system: Clear to auscultation. Respiratory effort normal. Cardiovascular system: S1 & S2 heard, RRR. No JVD, murmurs, rubs, gallops or clicks. No pedal edema. Gastrointestinal system: Abdomen is nondistended, soft and nontender.  Patient has palpable vague firm mass on her right upper quadrant and lateral quadrant.  Normal bowel sounds heard. Central nervous system: Alert and oriented. No focal neurological deficits. Extremities: Symmetric 5 x 5 power. Skin: Bilateral legs wrapped on compression stockings with Unna boot.  Distal neurovascular status intact. Psychiatry: Judgement and insight appear normal. Mood & affect appropriate.     Data Reviewed: I have personally reviewed following labs and imaging studies  CBC: Recent Labs  Lab 10/07/18 2044 10/08/18 0437  WBC 15.0* 14.3*  NEUTROABS 13.6*  --   HGB 11.3* 10.0*  HCT 37.4 33.6*  MCV 86.4 87.0  PLT 287 097   Basic Metabolic Panel: Recent Labs  Lab 10/07/18 2044 10/08/18 0100 10/08/18 0437  NA 135  --  135  K 5.0  --  4.3  CL 99  --  100  CO2 25  --  25  GLUCOSE 105*  --  99  BUN 54*  --  53*  CREATININE 2.05*  --  1.88*  CALCIUM  8.7*  --  8.4*  MG  --  2.0 1.9  PHOS  --   --  3.0   GFR: Estimated Creatinine Clearance: 17.3 mL/min (A) (by C-G formula based on SCr of 1.88 mg/dL (H)). Liver Function Tests: Recent Labs  Lab 10/07/18 2044 10/08/18 0437  AST 19 15  ALT 10 10  ALKPHOS 79 75  BILITOT 0.4 0.3  PROT 6.0* 5.4*  ALBUMIN 2.9* 2.6*   No results for input(s): LIPASE, AMYLASE in the last 168 hours. No results for input(s): AMMONIA in the last 168 hours. Coagulation Profile: Recent Labs  Lab 10/09/18 0608  INR 1.1   Cardiac Enzymes: Recent Labs  Lab 10/07/18 2044  TROPONINI <0.03   BNP (last 3 results) No results for input(s): PROBNP in the last 8760 hours. HbA1C: No results for input(s): HGBA1C in the last 72 hours. CBG: No results for input(s): GLUCAP in the last 168 hours. Lipid Profile: No results for input(s): CHOL, HDL, LDLCALC, TRIG, CHOLHDL, LDLDIRECT in the last 72 hours. Thyroid Function Tests: Recent Labs    10/08/18 0437  TSH 3.832   Anemia Panel: No results for input(s): VITAMINB12, FOLATE, FERRITIN, TIBC, IRON, RETICCTPCT in the last 72 hours. Sepsis Labs: No results for input(s): PROCALCITON, LATICACIDVEN in the last 168 hours.  Recent Results (from the past 240 hour(s))  SARS Coronavirus 2 (CEPHEID - Performed in [facility] hospital lab), Hosp Order     Status: None   Collection Time: 10/07/18  8:59 PM  Result Value Ref Range Status   SARS Coronavirus 2 NEGATIVE NEGATIVE Final    Comment: (NOTE) If result is NEGATIVE SARS-CoV-2 target nucleic acids are NOT DETECTED. The SARS-CoV-2 RNA is generally detectable in upper and lower  respiratory specimens during the acute phase of infection. The lowest  concentration of SARS-CoV-2 viral copies this assay can detect is 250  copies / mL. A negative result does not preclude SARS-CoV-2 infection  and should not be used as the sole basis for treatment or other  patient management decisions.  A negative result may occur  with  improper specimen collection / handling, submission of specimen other  than nasopharyngeal swab, presence of viral mutation(s) within the  areas targeted by this assay, and inadequate number of viral copies  (<250 copies / mL). A negative result must be combined with clinical  observations, patient history, and epidemiological information. If result is POSITIVE SARS-CoV-2 target nucleic acids are DETECTED. The SARS-CoV-2 RNA is generally detectable in upper and lower  respiratory specimens dur ing the acute phase of infection.  Positive  results are indicative of active infection with SARS-CoV-2.  Clinical  correlation with patient history and other diagnostic information is  necessary to determine patient infection status.  Positive results do  not rule out bacterial infection or co-infection with other viruses. If result is PRESUMPTIVE POSTIVE SARS-CoV-2 nucleic acids MAY BE PRESENT.  A presumptive positive result was obtained on the submitted specimen  and confirmed on repeat testing.  While 2019 novel coronavirus  (SARS-CoV-2) nucleic acids may be present in the submitted sample  additional confirmatory testing may be necessary for epidemiological  and / or clinical management purposes  to differentiate between  SARS-CoV-2 and other Sarbecovirus currently known to infect humans.  If clinically indicated additional testing with an alternate test  methodology [phone]) is advised. The SARS-CoV-2 RNA is generally  detectable in upper and lower respiratory sp ecimens during the acute  phase of infection. The expected result is Negative. Fact Sheet for Patients:  StrictlyIdeas.no Fact Sheet for Healthcare Providers: BankingDealers.co.za This test is not yet approved or cleared by the [place] FDA and has been authorized for detection and/or diagnosis of SARS-CoV-2 by FDA under an Emergency Use Authorization (EUA).  This EUA  will remain in effect (meaning this test can be used) for the duration of the COVID-19 declaration under Section 564(b)(1) of the Act, 21 U.S.C. section 360bbb-3(b)(1), unless the authorization is terminated or revoked sooner. Performed at [facility] [place], [place] [address] [place], [place] [address]   MRSA PCR Screening     Status: Abnormal   Collection Time: 10/08/18  2:43 AM  Result Value Ref Range Status   MRSA by PCR POSITIVE (A) NEGATIVE Final    Comment:        The GeneXpert MRSA Assay (FDA approved for NASAL specimens only), is one component of a comprehensive MRSA colonization surveillance program. It is not intended to diagnose MRSA infection nor to guide or monitor treatment for MRSA infections. CRITICAL RESULT CALLED TO, READ BACK BY AND VERIFIED WITH: RN Doristine Johns AT 4034 10/08/18 [place] A Performed at [facility], [place] [address], [place], [place] [address]          Radiology Studies: Ct Abdomen Pelvis Wo Contrast  Result Date: 10/08/2018 CLINICAL DATA:  85 year old female with palpable abdominal mass. Lower extremity swelling. EXAM: CT ABDOMEN AND PELVIS WITHOUT CONTRAST TECHNIQUE: Multidetector CT imaging of the abdomen and pelvis was performed following the standard protocol without IV contrast. COMPARISON:  CTA chest 02/26/2015 FINDINGS: Lower chest: Negative.  No pericardial or pleural effusion. Hepatobiliary: Trace perihepatic free fluid. Negative noncontrast liver. The gallbladder is visible on series 2, image 29 and is partially inseparable from a large lobulated right abdominal mass. The mass epicenter is in the right abdominal mesentery and tracks from the inferior liver edge to the pelvic inlet encompassing 93 by 148 by 118 millimeters (AP by transverse by CC). The mass is inseparable from multiple bowel loops, and might be contiguous with a bowel lumen on series 2, image 41 (versus necrotic/cavitary changes within the  mass. Small volume superimposed right gutter free fluid. Pancreas: Atrophied and difficult to delineate. Spleen: Negative. Adrenals/Urinary Tract: Both adrenal glands are difficult to delineate. The kidneys are nonobstructed. No nephrolithiasis. Diminutive and unremarkable urinary bladder. Stomach/Bowel: There is a small volume of oral contrast in the stomach and a larger volume of oral contrast in distal small bowel loops throughout the lower abdomen and pelvis. These small bowel loops define the caudal extent of the large lobulated abdominal mass described earlier on series 2, image 47. The mass appears to be intraperitoneal. The right colon and hepatic flexure are poorly delineated. The transverse colon is redundant. There are no dilated bowel loops. No free intraperitoneal air identified. Vascular/Lymphatic: Aortoiliac calcified atherosclerosis. There is a juxta renal abdominal aortic aneurysm (  series 2, image 20) estimated at 54 millimeters diameter. However, there is also abnormal nodular soft tissue bordering the aorta which renders some of the aortic contours difficult to delineate. There is chronic ectasia of the descending thoracic aorta and aorta at the hiatus of 35 millimeters. Vascular patency is not evaluated in the absence of IV contrast. Retrocrural and porta hepatis lymphadenopathy. Some of the individual nodes are difficult to delineate, but enlarged nodes are at least 11-17 millimeters short axis. Reproductive: Surgically absent uterus. There may be small ovaries along both pelvic sidewalls on series 2, image 49, uncertain. Other: Questionable trace pelvic free fluid on series 2, image 58. Musculoskeletal: No acute or suspicious osseous lesion. IMPRESSION: 1. Large, lobulated 15 centimeter mass located in the right abdomen is inseparable from large bowel as well as the gallbladder and inferior liver edge. The epicenter is within the right abdominal mesentery. A portion of the mass may be necrotic  and/or communicate with the bowel lumen (series 2, image 41). In conjunction with lymphadenopathy (see #2) the top differential considerations are Lymphoma (slightly favored), colon carcinoma, and less likely ovarian carcinoma. If the patient cannot receive CT IV contrast for better delineation then Abdomen MRI may be valuable. 2. Retrocrural, retroperitoneal, and right upper quadrant lymphadenopathy - some of which is inseparable from a juxta renal aortic aneurysm (see #3). 3. 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 Recommend vascular surgery consultation. 4. Small volume free fluid. No definite free air. No bowel obstruction. Electronically Signed   By: Genevie Ann M.D.   On: 10/08/2018 06:41   Dg Chest 2 View  Result Date: 10/07/2018 CLINICAL DATA:  Lower extremity pain and swelling for months, worse over the past 4 days. EXAM: CHEST - 2 VIEW COMPARISON:  CT chest and PA and lateral chest 02/26/2015. FINDINGS: The lungs are emphysematous but clear. Heart size is normal. No pneumothorax or pleural effusion. Aortic atherosclerosis noted. No acute or focal bony abnormality. IMPRESSION: Emphysema without acute disease. Atherosclerosis. Electronically Signed   By: Inge Rise M.D.   On: 10/07/2018 21:42   Ct Chest Wo Contrast  Result Date: 10/08/2018 CLINICAL DATA:  New abdominal mass. EXAM: CT CHEST WITHOUT CONTRAST TECHNIQUE: Multidetector CT imaging of the chest was performed following the standard protocol without IV contrast. COMPARISON:  CT chest dated February 26, 2015. FINDINGS: Cardiovascular: Normal heart size. No pericardial effusion. Coronary, aortic arch, and branch vessel atherosclerotic vascular disease. Localized saccular dilatations along the posterior aspect of the proximal and mid descending aorta have mildly increased in size since 2016. For example, the dilatation along the mid descending thoracic aorta  previously measured 1.0 x 1.4 x 1.2 cm, and now measures 1.0 x 1.7 x 1.6 cm. Similarly, focal rightward saccular dilatation along the distal descending thoracic aorta has also increased in size, now measuring 3.9 x 3.2 cm, previously 2.5 x 2.1 cm. Mediastinum/Nodes: Enlarged lower periaortic lymph nodes measuring up to 1.3 cm in short axis. No enlarged mediastinal or axillary lymph nodes. Thyroid gland, trachea, and esophagus demonstrate no significant findings. Lungs/Pleura: Severe centrilobular emphysema. New linear, slightly nodular density in the posterior right upper lobe is favored to reflect scarring. Unchanged scarring in the left lower lobe. New 6 mm slightly spiculated nodule in the left upper lobe (series 5, image 80). New 8 x 7 mm spiculated nodule in the right lower lobe (series 5, image 134). No focal consolidation, pleural effusion, or pneumothorax. Upper Abdomen:  Please see separate CT abdomen pelvis report from same day. Musculoskeletal: No chest Remick mass or suspicious bone lesions identified. IMPRESSION: 1. Lower para-aortic lymphadenopathy measuring up to 1.3 cm. 2. New 8 mm right lower lobe and 6 mm left upper lobe spiculated nodules. These could represent metastases or synchronous primary bronchogenic carcinomas. Follow-up non-contrast chest CT at 3-6 months is recommended. 3. Enlarging saccular aneurysm versus penetrating ulcer along the distal descending thoracic aorta, measuring 3.9 x 3.2 cm, previously 2.5 x 2.1 cm. Additional focal saccular dilatations along the proximal and mid descending aorta have also mildly increased in size. Evaluation is limited without intravenous contrast. 4. Aortic atherosclerosis (ICD10-I70.0). 5. Emphysema (ICD10-J43.9). Electronically Signed   By: Titus Dubin M.D.   On: 10/08/2018 19:52   US Biopsy (liver)  Result Date: 10/09/2018 INDICATION: 85 year old with large abdominal mass, lymphadenopathy and probable liver lesion. Patient needs a tissue  diagnosis. EXAM: ULTRASOUND-GUIDED LIVER LESION BIOPSY MEDICATIONS: None. ANESTHESIA/SEDATION: Moderate (conscious) sedation was employed during this procedure. A total of Versed 1.0 mg and Fentanyl 50 mcg was administered intravenously. Moderate Sedation Time: 11 minutes. The patient's level of consciousness and vital signs were monitored continuously by radiology nursing throughout the procedure under my direct supervision. FLUOROSCOPY TIME:  None COMPLICATIONS: None immediate. PROCEDURE: Abdomen was evaluated with ultrasound. A liver lesion was selected for percutaneous biopsy. Informed written consent was obtained from the patient after a thorough discussion of the procedural risks, benefits and alternatives. All questions were addressed. Maximal Sterile Barrier Technique was utilized including caps, mask, sterile gowns, sterile gloves, sterile drape, hand hygiene and skin antiseptic. A timeout was performed prior to the initiation of the procedure. Anterior right abdomen was prepped with chlorhexidine and sterile field was created. Skin and soft tissues anesthetized with 1% lidocaine. Using ultrasound guidance, 17 gauge coaxial needle directed into the liver lesion. Total of 4 core biopsies were obtained with an 18 gauge core device. Specimens placed in saline. 17 gauge needle was removed without complication. Bandage placed over the puncture site. FINDINGS: Large palpable soft tissue mass in the anterior abdomen. This mass corresponds with the recent CT findings. Not clear if the abdominal mass is associated with bowel. In addition, there is a hypoechoic lesion in the right hepatic lobe. Needle position confirmed within this hepatic lesion. Four core biopsies were obtained. Specimens placed in saline. No evidence for bleeding or hematoma formation following the hepatic lesion biopsy. Trace perihepatic ascites along the right lateral abdomen. Multiple enlarged upper abdominal lymph nodes. IMPRESSION:  Ultrasound-guided core biopsies of a hepatic lesion. Electronically Signed   By: Markus Daft M.D.   On: 10/09/2018 13:27   Vas [place] Lower Extremity Venous (dvt)  Result Date: 10/08/2018  Lower Venous Study Indications: Pain.  Performing Technologist: Oliver Hum RVT  Examination Guidelines: A complete evaluation includes B-mode imaging, spectral Doppler, color Doppler, and power Doppler as needed of all accessible portions of each vessel. Bilateral testing is considered an integral part of a complete examination. Limited examinations for reoccurring indications may be performed as noted.  +---------+---------------+---------+-----------+----------+-------+  RIGHT     Compressibility Phasicity Spontaneity Properties Summary  +---------+---------------+---------+-----------+----------+-------+  CFV       Full            Yes       Yes                             +---------+---------------+---------+-----------+----------+-------+  SFJ  Full                                                      +---------+---------------+---------+-----------+----------+-------+  FV Prox   Full                                                      +---------+---------------+---------+-----------+----------+-------+  FV Mid    Full                                                      +---------+---------------+---------+-----------+----------+-------+  FV Distal Full                                                      +---------+---------------+---------+-----------+----------+-------+  PFV       Full                                                      +---------+---------------+---------+-----------+----------+-------+  POP       Full            Yes       Yes                             +---------+---------------+---------+-----------+----------+-------+  PTV       Full                                                      +---------+---------------+---------+-----------+----------+-------+  PERO      Full                                                       +---------+---------------+---------+-----------+----------+-------+   +---------+---------------+---------+-----------+----------+-------+  LEFT      Compressibility Phasicity Spontaneity Properties Summary  +---------+---------------+---------+-----------+----------+-------+  CFV       Full            Yes       Yes                             +---------+---------------+---------+-----------+----------+-------+  SFJ       Full                                                      +---------+---------------+---------+-----------+----------+-------+  FV Prox   Full                                                      +---------+---------------+---------+-----------+----------+-------+  FV Mid    Full                                                      +---------+---------------+---------+-----------+----------+-------+  FV Distal Full                                                      +---------+---------------+---------+-----------+----------+-------+  PFV       Full                                                      +---------+---------------+---------+-----------+----------+-------+  POP       Full            Yes       Yes                             +---------+---------------+---------+-----------+----------+-------+  PTV       Full                                                      +---------+---------------+---------+-----------+----------+-------+  PERO      Full                                                      +---------+---------------+---------+-----------+----------+-------+     Summary: Right: There is no evidence of deep vein thrombosis in the lower extremity. No cystic structure found in the popliteal fossa. Left: There is no evidence of deep vein thrombosis in the lower extremity. No cystic structure found in the popliteal fossa.  *See table(s) above for measurements and observations. Electronically signed by Deitra Mayo MD on 10/08/2018 at 7:02:13 PM.     Final         Scheduled Meds:  Chlorhexidine Gluconate Cloth  6 each Topical Q0600   feeding supplement (ENSURE ENLIVE)  237 mL Oral BID BM   fentaNYL       guaiFENesin  600 mg Oral BID   lidocaine       midazolam       multivitamin with minerals  1 tablet Oral Daily   mupirocin ointment  1 application Nasal BID   Continuous Infusions:  sodium chloride 100 mL/hr at 10/09/18 0514   doxycycline (VIBRAMYCIN) IV 100 mg (10/09/18 0849)     LOS: 1  day    Time spent: 25 minutes.    Barb Merino, MD [facility] Pager [phone]  If 7PM-7AM, please contact night-coverage [email or web address] Password [facility] 10/09/2018, 3:33 PM

## 2018-10-09 NOTE — Consult Note (Signed)
Chief Complaint: Patient was seen in consultation today for image guided biopsy of abdominal mass/possible liver lesion/possible lymph node Chief Complaint  Patient presents with   Leg Swelling    Referring Physician(s): Samtani,J/Mohamed,M  Supervising Physician: Markus Daft  Patient Status: [facility] - In-pt  History of Present Illness: Jasmine Horton is an 85 y.o. female , ex-smoker, with past medical history of hypertension, hyperlipidemia, COPD, CHF, remote hysterectomy (ovaries were not removed per pt) and known abdominal mass who recently presented to [facility] with weakness, weight loss, lower extremity edema/cellulitis and found to be in renal failure with associated leukocytosis. Lower extremity venous Dopplers were negative for DVT.  She states she has had right abdominal mass for some time, did not pursue evaluation and originally thought it was just a hernia.  It is tender to palpation.  Subsequent imaging revealed large lobulated 15 cm mass in the right abdomen inseparable from the large bowel as well as gallbladder and inferior liver edge.  A portion of the mass may be necrotic and/or communicate with the bowel lumen.  Also present was retrocrural/retroperitoneal/porta hepatis lymphadenopathy, 5.4 cm AAA, new 8 mm right lower lobe and 6 mm left upper lobe spiculated lung nodules, enlarging saccular aneurysm versus penetrating ulcer along distal descending thoracic aorta.  Request now received for image guided biopsy of the abdominal mass/?node/?liver lesion.  Past Medical History:  Diagnosis Date   COPD (chronic obstructive pulmonary disease) ([place])    Hyperlipidemia    Hypertension    Seasonal allergies     Past Surgical History:  Procedure Laterality Date   APPENDECTOMY  1953   CATARACT EXTRACTION Left 02/2008   TOTAL ABDOMINAL HYSTERECTOMY  1975    Allergies: Penicillins  Medications: Prior to Admission medications   Medication Sig Start Date  End Date Taking? Authorizing Provider  albuterol (PROVENTIL HFA;VENTOLIN HFA) 108 (90 BASE) MCG/ACT inhaler Inhale 2 puffs into the lungs every 6 (six) hours as needed for wheezing or shortness of breath. 02/28/15   Burns, Arloa Koh, MD  hydrochlorothiazide (HYDRODIURIL) 25 MG tablet Take 1 tablet (25 mg total) by mouth daily. Patient not taking: Reported on 10/07/2018 02/28/15   Florinda Marker, MD  tiotropium (SPIRIVA) 18 MCG inhalation capsule Place 1 capsule (18 mcg total) into inhaler and inhale daily. Patient not taking: Reported on 10/07/2018 02/28/15   Florinda Marker, MD     Family History  Problem Relation Age of Onset   Dementia Mother    Emphysema Father    Breast cancer Daughter    CAD Neg Hx    Diabetes Neg Hx    Stroke Neg Hx     Social History   Socioeconomic History   Marital status: Divorced    Spouse name: Not on file   Number of children: 3   Years of education: Not on file   Highest education level: Not on file  Occupational History   Not on file  Social Needs   Financial resource strain: Not on file   Food insecurity:    Worry: Not on file    Inability: Not on file   Transportation needs:    Medical: Not on file    Non-medical: Not on file  Tobacco Use   Smoking status: Former Smoker    Packs/day: 0.50    Years: 30.00    Pack years: 15.00    Types: Cigarettes    Last attempt to quit: 04/15/2014    Years since quitting: 4.4  Smokeless tobacco: Never Used  Substance and Sexual Activity   Alcohol use: Not Currently   Drug use: No   Sexual activity: Not on file  Lifestyle   Physical activity:    Days per week: Not on file    Minutes per session: Not on file   Stress: Not on file  Relationships   Social connections:    Talks on phone: Not on file    Gets together: Not on file    Attends religious service: Not on file    Active member of club or organization: Not on file    Attends meetings of clubs or organizations: Not on  file    Relationship status: Not on file  Other Topics Concern   Not on file  Social History Narrative   Lives with son      Review of Systems see above; currently denies fever, headache, chest pain, cough, nausea, vomiting or bleeding.  She does have some intermittent back pain and constipation  Vital Signs: BP (!) 146/94    Pulse 80    Temp 97.9 F (36.6 C) (Oral)    Resp 18    Ht _0  (1.575 m)    Wt 105 lb (47.6 kg)    SpO2 95%    BMI 19.20 kg/m   Physical Exam thin white female in no acute distress ;awake, alert.  Chest with distant breath sounds bilaterally.  Heart with regular rate and rhythm.  Abdomen distended, firm right abdominal mass, tender to palpation, positive bowel sounds.  Bilateral lower extremity edema noted, both lower legs now bandaged.  Imaging: Ct Abdomen Pelvis Wo Contrast  Result Date: 10/08/2018 CLINICAL DATA:  85 year old female with palpable abdominal mass. Lower extremity swelling. EXAM: CT ABDOMEN AND PELVIS WITHOUT CONTRAST TECHNIQUE: Multidetector CT imaging of the abdomen and pelvis was performed following the standard protocol without IV contrast. COMPARISON:  CTA chest 02/26/2015 FINDINGS: Lower chest: Negative.  No pericardial or pleural effusion. Hepatobiliary: Trace perihepatic free fluid. Negative noncontrast liver. The gallbladder is visible on series 2, image 29 and is partially inseparable from a large lobulated right abdominal mass. The mass epicenter is in the right abdominal mesentery and tracks from the inferior liver edge to the pelvic inlet encompassing 93 by 148 by 118 millimeters (AP by transverse by CC). The mass is inseparable from multiple bowel loops, and might be contiguous with a bowel lumen on series 2, image 41 (versus necrotic/cavitary changes within the mass. Small volume superimposed right gutter free fluid. Pancreas: Atrophied and difficult to delineate. Spleen: Negative. Adrenals/Urinary Tract: Both adrenal glands are difficult  to delineate. The kidneys are nonobstructed. No nephrolithiasis. Diminutive and unremarkable urinary bladder. Stomach/Bowel: There is a small volume of oral contrast in the stomach and a larger volume of oral contrast in distal small bowel loops throughout the lower abdomen and pelvis. These small bowel loops define the caudal extent of the large lobulated abdominal mass described earlier on series 2, image 47. The mass appears to be intraperitoneal. The right colon and hepatic flexure are poorly delineated. The transverse colon is redundant. There are no dilated bowel loops. No free intraperitoneal air identified. Vascular/Lymphatic: Aortoiliac calcified atherosclerosis. 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 However, there is also abnormal nodular soft tissue bordering the aorta which renders some of the aortic contours difficult to delineate. There is chronic ectasia of the descending thoracic aorta and aorta at the hiatus of 35  millimeters. Vascular patency is not evaluated in the absence of IV contrast. Retrocrural and porta hepatis lymphadenopathy. Some of the individual nodes are difficult to delineate, but enlarged nodes are at least 11-17 millimeters short axis. Reproductive: Surgically absent uterus. There may be small ovaries along both pelvic sidewalls on series 2, image 49, uncertain. Other: Questionable trace pelvic free fluid on series 2, image 58. Musculoskeletal: No acute or suspicious osseous lesion. IMPRESSION: 1. Large, lobulated 15 centimeter mass located in the right abdomen is inseparable from large bowel as well as the gallbladder and inferior liver edge. The epicenter is within the right abdominal mesentery. A portion of the mass may be necrotic and/or communicate with the bowel lumen (series 2, image 41). In conjunction with lymphadenopathy (see #2) the top differential considerations are Lymphoma (slightly favored), colon  carcinoma, and less likely ovarian carcinoma. If the patient cannot receive CT IV contrast for better delineation then Abdomen MRI may be valuable. 2. Retrocrural, retroperitoneal, and right upper quadrant lymphadenopathy - some of which is inseparable from a juxta renal aortic aneurysm (see #3). 3. 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 Recommend vascular surgery consultation. 4. Small volume free fluid. No definite free air. No bowel obstruction. Electronically Signed   By: Genevie Ann M.D.   On: 10/08/2018 06:41   Dg Chest 2 View  Result Date: 10/07/2018 CLINICAL DATA:  Lower extremity pain and swelling for months, worse over the past 4 days. EXAM: CHEST - 2 VIEW COMPARISON:  CT chest and PA and lateral chest 02/26/2015. FINDINGS: The lungs are emphysematous but clear. Heart size is normal. No pneumothorax or pleural effusion. Aortic atherosclerosis noted. No acute or focal bony abnormality. IMPRESSION: Emphysema without acute disease. Atherosclerosis. Electronically Signed   By: Inge Rise M.D.   On: 10/07/2018 21:42   Ct Chest Wo Contrast  Result Date: 10/08/2018 CLINICAL DATA:  New abdominal mass. EXAM: CT CHEST WITHOUT CONTRAST TECHNIQUE: Multidetector CT imaging of the chest was performed following the standard protocol without IV contrast. COMPARISON:  CT chest dated February 26, 2015. FINDINGS: Cardiovascular: Normal heart size. No pericardial effusion. Coronary, aortic arch, and branch vessel atherosclerotic vascular disease. Localized saccular dilatations along the posterior aspect of the proximal and mid descending aorta have mildly increased in size since 2016. For example, the dilatation along the mid descending thoracic aorta previously measured 1.0 x 1.4 x 1.2 cm, and now measures 1.0 x 1.7 x 1.6 cm. Similarly, focal rightward saccular dilatation along the distal descending thoracic aorta has also increased  in size, now measuring 3.9 x 3.2 cm, previously 2.5 x 2.1 cm. Mediastinum/Nodes: Enlarged lower periaortic lymph nodes measuring up to 1.3 cm in short axis. No enlarged mediastinal or axillary lymph nodes. Thyroid gland, trachea, and esophagus demonstrate no significant findings. Lungs/Pleura: Severe centrilobular emphysema. New linear, slightly nodular density in the posterior right upper lobe is favored to reflect scarring. Unchanged scarring in the left lower lobe. New 6 mm slightly spiculated nodule in the left upper lobe (series 5, image 80). New 8 x 7 mm spiculated nodule in the right lower lobe (series 5, image 134). No focal consolidation, pleural effusion, or pneumothorax. Upper Abdomen: Please see separate CT abdomen pelvis report from same day. Musculoskeletal: No chest Kaatz mass or suspicious bone lesions identified. IMPRESSION: 1. Lower para-aortic lymphadenopathy measuring up to 1.3 cm. 2. New 8 mm right lower lobe and 6 mm left upper lobe spiculated nodules. These  could represent metastases or synchronous primary bronchogenic carcinomas. Follow-up non-contrast chest CT at 3-6 months is recommended. 3. Enlarging saccular aneurysm versus penetrating ulcer along the distal descending thoracic aorta, measuring 3.9 x 3.2 cm, previously 2.5 x 2.1 cm. Additional focal saccular dilatations along the proximal and mid descending aorta have also mildly increased in size. Evaluation is limited without intravenous contrast. 4. Aortic atherosclerosis (ICD10-I70.0). 5. Emphysema (ICD10-J43.9). Electronically Signed   By: Titus Dubin M.D.   On: 10/08/2018 19:52   Vas [place] Lower Extremity Venous (dvt)  Result Date: 10/08/2018  Lower Venous Study Indications: Pain.  Performing Technologist: Oliver Hum RVT  Examination Guidelines: A complete evaluation includes B-mode imaging, spectral Doppler, color Doppler, and power Doppler as needed of all accessible portions of each vessel. Bilateral testing is  considered an integral part of a complete examination. Limited examinations for reoccurring indications may be performed as noted.  +---------+---------------+---------+-----------+----------+-------+  RIGHT     Compressibility Phasicity Spontaneity Properties Summary  +---------+---------------+---------+-----------+----------+-------+  CFV       Full            Yes       Yes                             +---------+---------------+---------+-----------+----------+-------+  SFJ       Full                                                      +---------+---------------+---------+-----------+----------+-------+  FV Prox   Full                                                      +---------+---------------+---------+-----------+----------+-------+  FV Mid    Full                                                      +---------+---------------+---------+-----------+----------+-------+  FV Distal Full                                                      +---------+---------------+---------+-----------+----------+-------+  PFV       Full                                                      +---------+---------------+---------+-----------+----------+-------+  POP       Full            Yes       Yes                             +---------+---------------+---------+-----------+----------+-------+  PTV       Full                                                      +---------+---------------+---------+-----------+----------+-------+  PERO      Full                                                      +---------+---------------+---------+-----------+----------+-------+   +---------+---------------+---------+-----------+----------+-------+  LEFT      Compressibility Phasicity Spontaneity Properties Summary  +---------+---------------+---------+-----------+----------+-------+  CFV       Full            Yes       Yes                             +---------+---------------+---------+-----------+----------+-------+  SFJ       Full                                                       +---------+---------------+---------+-----------+----------+-------+  FV Prox   Full                                                      +---------+---------------+---------+-----------+----------+-------+  FV Mid    Full                                                      +---------+---------------+---------+-----------+----------+-------+  FV Distal Full                                                      +---------+---------------+---------+-----------+----------+-------+  PFV       Full                                                      +---------+---------------+---------+-----------+----------+-------+  POP       Full            Yes       Yes                             +---------+---------------+---------+-----------+----------+-------+  PTV       Full                                                      +---------+---------------+---------+-----------+----------+-------+  PERO      Full                                                      +---------+---------------+---------+-----------+----------+-------+  Summary: Right: There is no evidence of deep vein thrombosis in the lower extremity. No cystic structure found in the popliteal fossa. Left: There is no evidence of deep vein thrombosis in the lower extremity. No cystic structure found in the popliteal fossa.  *See table(s) above for measurements and observations. Electronically signed by Deitra Mayo MD on 10/08/2018 at 7:02:13 PM.    Final     Labs:  CBC: Recent Labs    10/07/18 2044 10/08/18 0437  WBC 15.0* 14.3*  HGB 11.3* 10.0*  HCT 37.4 33.6*  PLT 287 254    COAGS: Recent Labs    10/09/18 0608  INR 1.1    BMP: Recent Labs    10/07/18 2044 10/08/18 0437  NA 135 135  K 5.0 4.3  CL 99 100  CO2 25 25  GLUCOSE 105* 99  BUN 54* 53*  CALCIUM 8.7* 8.4*  CREATININE 2.05* 1.88*  GFRNONAA 22* 24*  GFRAA 26* 28*    LIVER FUNCTION TESTS: Recent Labs     10/07/18 2044 10/08/18 0437  BILITOT 0.4 0.3  AST 19 15  ALT 10 10  ALKPHOS 79 75  PROT 6.0* 5.4*  ALBUMIN 2.9* 2.6*    TUMOR MARKERS: No results for input(s): AFPTM, CEA, CA199, CHROMGRNA in the last 8760 hours.  Assessment and Plan: 85 y.o. female , ex-smoker, with past medical history of hypertension, hyperlipidemia, COPD, CHF, remote hysterectomy (ovaries were not removed per pt) and known abdominal mass who recently presented to [facility] with weakness, weight loss, lower extremity edema/cellulitis and found to be in renal failure with associated leukocytosis. Lower extremity venous Dopplers were negative for DVT.  No known hx cancer. She states she has had right abdominal mass for some time, did not pursue evaluation and originally thought it was just a hernia.  It is tender to palpation.  Subsequent imaging revealed large lobulated 15 cm mass in the right abdomen inseparable from the large bowel as well as gallbladder and inferior liver edge.  A portion of the mass may be necrotic and/or communicate with the bowel lumen.  Also present was retrocrural/retroperitoneal/porta hepatis lymphadenopathy, 5.4 cm AAA, new 8 mm right lower lobe and 6 mm left upper lobe spiculated lung nodules, enlarging saccular aneurysm versus penetrating ulcer along distal descending thoracic aorta.  Request now received for image guided biopsy of the abdominal mass/?node/?liver lesion.  Imaging studies have been reviewed by Dr.Henn.  Current labs include WBC 14.3, hemoglobin 10, platelets 254k, creatinine 1.88, PT/INR normal, COVID-19 negative.Risks and benefits of procedure was discussed with the patient and/or patient's family including, but not limited to bleeding, infection, damage to adjacent structures or low yield requiring additional tests.  All of the questions were answered and there is agreement to proceed.  Consent signed and in chart.  Procedure scheduled for today.   Thank you for this  interesting consult.  I greatly enjoyed meeting MICHELLA DETJEN and look forward to participating in their care.  A copy of this report was sent to the requesting provider on this date.  Electronically Signed: D. Rowe Robert, PA-C 10/09/2018, 9:56 AM   I spent a total of 25 minutes    in face to face in clinical consultation, greater than 50% of which was counseling/coordinating care for image guided biopsy of abdominal mass?  Lymph node?  liver lesion

## 2018-10-09 NOTE — Progress Notes (Signed)
Initial Nutrition Assessment  RD working remotely.   DOCUMENTATION CODES:   (unable to assess for malnutrition at this time)  INTERVENTION:  - will order Ensure Enlive BID, each supplement provides 350 kcal and 20 grams of protein. - will order daily multivitamin with minerals. - continue to encourage PO intakes.    NUTRITION DIAGNOSIS:   Inadequate oral intake related to poor appetite as evidenced by per patient/family report.  GOAL:   Patient will meet greater than or equal to 90% of their needs  MONITOR:   Diet advancement, PO intake, Supplement acceptance, Labs, Weight trends  REASON FOR ASSESSMENT:   Consult Assessment of nutrition requirement/status  ASSESSMENT:   85 y.o. female with medical history significant of HTN, hyperlipidemia, asthmatic bronchitis with possible COPD, diastolic CHF. She presented to the ED d/t weeping sores on her ankles, ankles being cool to the touch, and pain that has been ongoing for unknown amount of time. She has not followed up with a doctor for several years. She is still able to ambulate. Patient reported decreased appetite and PO intakes for "a long time" and that she has lost "a lot" of weight. Patient is no longer taking any of her medications. COVID-19 negative in the ED.   Patient NPO changed to Soft diet yesterday at 4:00 PM and then made NPO at midnight and advanced to Regular a few minutes ago. No intakes documented since admission. Current weight is 105 lb. Patient has not been to a medical appointment in many years and the only other weight in the chart is from [facility] on 07/16/2014 when she weighed 95 lb.   Unable to talk with patient today. Notes indicate that patient reported poor appetite for a prolonged period of time and that she had lost a lot of weight during that time. No further details known at this time.   Patient was out of her room to Radiology for liver biopsy for suspected metastatic disease. Will continue to monitor  medical course and may need to adjust estimated nutrition needs and interventions.    Medications reviewed. Labs reviewed; BUN: 53 mg/dl today, creatinine: 1.88 mg/dl, Ca: 8.4 mg/dl, GFR: 24 ml/min. IVF; NS @ 100 ml/hr.      NUTRITION - FOCUSED PHYSICAL EXAM:  unable to perform at this time.   Diet Order:   Diet Order            Diet regular Room service appropriate? Yes; Fluid consistency: Thin  Diet effective now              EDUCATION NEEDS:   Not appropriate for education at this time  Skin:  Skin Assessment: Reviewed RN Assessment  Last BM:  5/11  Height:   Ht Readings from Last 1 Encounters:  10/07/18 _0  (1.575 m)    Weight:   Wt Readings from Last 1 Encounters:  10/07/18 47.6 kg    Ideal Body Weight:  50 kg  BMI:  Body mass index is 19.2 kg/m.  Estimated Nutritional Needs:   Kcal:  1430-1665 kcal  Protein:  65-75 grams  Fluid:  >/= 1.6 L/day     Jarome Matin, MS, RD, LDN, [facility] Inpatient Clinical Dietitian Pager # [phone] After hours/weekend pager # [phone]

## 2018-10-09 NOTE — Progress Notes (Signed)
OT Cancellation Note  Patient Details Name: CONCEPCION GILLOTT MRN: [id number] DOB: 07-20-1933   Cancelled Treatment:    Reason Eval/Treat Not Completed: Active bedrest order for 3 hours.  Will check on pt next day  Kari Baars, [place] [phone] Office- [phone], Thereasa Parkin 10/09/2018, 2:58 PM

## 2018-10-09 NOTE — Progress Notes (Signed)
Received call from telemetry - pt had 4 beats Vtach while gone for biopsy.  Patient now resting in bed - VS stable.  Dr. Sloan Leiter notified via text page.

## 2018-10-09 NOTE — Procedures (Signed)
Interventional Radiology Procedure:   Indications: Metastatic disease and needs tissue diagnosis  Procedure: US guided liver lesion biopsy  Findings: 4 cores from a liver lesion, samples placed in saline  Complications: None     EBL: less than 10 ml  Plan: Bedrest 3 hours.     Jadriel Saxer R. Anselm Pancoast, MD  Pager: [phone]

## 2018-10-10 LAB — CBC WITH DIFFERENTIAL/PLATELET
Abs Immature Granulocytes: 0.06 10*3/uL (ref 0.00–0.07)
Basophils Absolute: 0 10*3/uL (ref 0.0–0.1)
Basophils Relative: 0 %
Eosinophils Absolute: 0.2 10*3/uL (ref 0.0–0.5)
Eosinophils Relative: 1 %
HCT: 35.7 % — ABNORMAL LOW (ref 36.0–46.0)
Hemoglobin: 10.1 g/dL — ABNORMAL LOW (ref 12.0–15.0)
Immature Granulocytes: 1 %
Lymphocytes Relative: 8 %
Lymphs Abs: 1 10*3/uL (ref 0.7–4.0)
MCH: 25.7 pg — ABNORMAL LOW (ref 26.0–34.0)
MCHC: 28.3 g/dL — ABNORMAL LOW (ref 30.0–36.0)
MCV: 90.8 fL (ref 80.0–100.0)
Monocytes Absolute: 0.9 10*3/uL (ref 0.1–1.0)
Monocytes Relative: 7 %
Neutro Abs: 10.4 10*3/uL — ABNORMAL HIGH (ref 1.7–7.7)
Neutrophils Relative %: 83 %
Platelets: 254 10*3/uL (ref 150–400)
RBC: 3.93 MIL/uL (ref 3.87–5.11)
RDW: 15.7 % — ABNORMAL HIGH (ref 11.5–15.5)
WBC: 12.6 10*3/uL — ABNORMAL HIGH (ref 4.0–10.5)
nRBC: 0 % (ref 0.0–0.2)

## 2018-10-10 LAB — BASIC METABOLIC PANEL
Anion gap: 6 (ref 5–15)
BUN: 26 mg/dL — ABNORMAL HIGH (ref 8–23)
CO2: 21 mmol/L — ABNORMAL LOW (ref 22–32)
Calcium: 8.1 mg/dL — ABNORMAL LOW (ref 8.9–10.3)
Chloride: 110 mmol/L (ref 98–111)
Creatinine, Ser: 1.15 mg/dL — ABNORMAL HIGH (ref 0.44–1.00)
GFR calc Af Amer: 51 mL/min — ABNORMAL LOW (ref >60)
GFR calc non Af Amer: 44 mL/min — ABNORMAL LOW (ref >60)
Glucose, Bld: 78 mg/dL (ref 70–99)
Potassium: 4.1 mmol/L (ref 3.5–5.1)
Sodium: 137 mmol/L (ref 135–145)

## 2018-10-10 LAB — CA 125: Cancer Antigen (CA) 125: 52.8 U/mL — ABNORMAL HIGH (ref 0.0–38.1)

## 2018-10-10 LAB — AFP TUMOR MARKER: AFP, Serum, Tumor Marker: 2 ng/mL (ref 0.0–8.3)

## 2018-10-10 LAB — MAGNESIUM: Magnesium: 1.7 mg/dL (ref 1.7–2.4)

## 2018-10-10 LAB — CANCER ANTIGEN 19-9: CA 19-9: 274 U/mL — ABNORMAL HIGH (ref 0–35)

## 2018-10-10 LAB — CEA: CEA: 111 ng/mL — ABNORMAL HIGH (ref 0.0–4.7)

## 2018-10-10 MED ORDER — DOXYCYCLINE HYCLATE 50 MG PO CAPS: 50.0000 mg | ORAL_CAPSULE | Freq: Two times a day (BID) | ORAL | 0 refills | Status: AC

## 2018-10-10 MED ORDER — ALBUTEROL SULFATE HFA 108 (90 BASE) MCG/ACT IN AERS: 2.0000 | INHALATION_SPRAY | Freq: Four times a day (QID) | RESPIRATORY_TRACT | 2 refills | Status: AC | PRN

## 2018-10-10 NOTE — Progress Notes (Signed)
Pt discharged home today per Dr. Sloan Leiter. Pt's IV site D/C'd and WDL. Pt's VSS. Pt provided with home medication list, discharge instructions and prescriptions. Verbalized understanding. Pt currently awaiting arrival of [facility] for transport home. Pt and patient's son aware of plan of care.

## 2018-10-10 NOTE — Progress Notes (Signed)
Occupational Therapy Treatment Patient Details Name: Jasmine Horton MRN: [id number] DOB: 09/06/1933 Today's Date: 10/10/2018    History of present illness 85 year old female was admitted for leg weeping and feeling cool to touch. Incidential finding of 15 cm mass in abdomen, possible lymphoma vs colon carcinoma vs ovarian cancer. Pt undergoing testing, results pending. PMH:  HTN, asthmatic bronchitis with possible COPD, CHF   OT comments  Pt states that she will have to complete adls on her own at home (son will not help with these), but she needed a lot of encouragement to perform herself. She is quick to ask for assistance.  Explained we needed to figure out what help she needed to figure out a different way, if needed for her to be independent. She mostly needs min guard to min A. She is not agreeable to having 3:1 over toilet to make transfers easier. Recommend HHOT   Follow Up Recommendations  Home health OT;Supervision/Assistance - 24 hour    Equipment Recommendations  (pt declines 3:1)    Recommendations for Other Services      Precautions / Restrictions Precautions Precautions: Fall Restrictions Weight Bearing Restrictions: No       Mobility Bed Mobility BY PT Overal bed mobility: Needs Assistance Bed Mobility: Supine to Sit     Supine to sit: Min guard;HOB elevated     General bed mobility comments: Min guard for supine to sit for safety, pt with use of bedrails and increased time.  Transfers Overall transfer level: Needs assistance Equipment used: Rolling walker (2 wheeled) Transfers: Sit to/from Stand Sit to Stand: Min guard         General transfer comment: cues for safe transition position and use of UEs to self assist    Balance Overall balance assessment: Needs assistance Sitting-balance support: No upper extremity supported;Feet supported Sitting balance-Leahy Scale: Good     Standing balance support: Bilateral upper extremity supported Standing  balance-Leahy Scale: Poor Standing balance comment: reliant on UE support in standing                           ADL either performed or assessed with clinical judgement   ADL   Eating/Feeding: Set up                   Lower Body Dressing: Minimal assistance;Sit to/from stand   Toilet Transfer: Min guard;Stand-pivot;BSC   Toileting- Water quality scientist and Hygiene: Minimal assistance         General ADL Comments: pt needed encouragement to do as much for herself as she can. She states that son will not help her at home. She did not want to set up her own lunch stating that IV hurt.  She also needed encouragement to wipe herself after using toilet.  Does not want 3:1 for over her toilet at home, although she states getting up from a low toilet is difficult.       Vision       Perception     Praxis      Cognition Arousal/Alertness: Awake/alert Behavior During Therapy: WFL for tasks assessed/performed Overall Cognitive Status: Within Functional Limits for tasks assessed                                          Exercises     Shoulder Instructions  General Comments      Pertinent Vitals/ Pain       Pain Assessment: 0-10 Pain Score: 3  Pain Location: bil feet/legs with mobility  Pain Descriptors / Indicators: Sore Pain Intervention(s): Limited activity within patient's tolerance;Monitored during session  Home Living                                          Prior Functioning/Environment              Frequency  Min 2X/week        Progress Toward Goals  OT Goals(current goals can now be found in the care plan section)  Progress towards OT goals: Progressing toward goals  Acute Rehab OT Goals Patient Stated Goal: none stated with PT Time For Goal Achievement: 10/22/18  Plan      Co-evaluation                 AM-PAC OT "6 Clicks" Daily Activity     Outcome Measure   Help from  another person eating meals?: A Little Help from another person taking care of personal grooming?: A Little Help from another person toileting, which includes using toliet, bedpan, or urinal?: A Little Help from another person bathing (including washing, rinsing, drying)?: A Little Help from another person to put on and taking off regular upper body clothing?: A Little Help from another person to put on and taking off regular lower body clothing?: A Little 6 Click Score: 18    End of Session    OT Visit Diagnosis: Unsteadiness on feet (R26.81);Pain Pain - part of body: Leg   Activity Tolerance Patient tolerated treatment well   Patient Left in bed;with call bell/phone within reach;with bed alarm set   Nurse Communication          Time: 9978-0208 OT Time Calculation (min): 22 min  Charges: OT General Charges $OT Visit: 1 Visit OT Treatments $Self Care/Home Management : 8-22 mins  Lesle Chris, OTR/L Acute Rehabilitation Services [phone] [place] pager [phone] office 10/10/2018   [place] 10/10/2018, 12:55 PM

## 2018-10-10 NOTE — Progress Notes (Signed)
Physical Therapy Treatment Patient Details Name: Jasmine Horton MRN: [id number] DOB: [birth year] Today's Date: 10/10/2018    History of Present Illness 85 year old female was admitted for leg weeping and feeling cool to touch. Incidential finding of 15 cm mass in abdomen, possible lymphoma vs colon carcinoma vs ovarian cancer. Pt undergoing testing, results pending. PMH:  HTN, asthmatic bronchitis with possible COPD, CHF    PT Comments    Pt progressing but slowly with mobility and requiring increased time for all activities.  Pt plans dc home this date with assist of son.  Pt ambulating in this facility with use of RW for stability and pt declines to attempt ambulation without use of RW but pt states she does not need one at home.   Follow Up Recommendations  SNF;Home health PT;Supervision/Assistance - 24 hour     Equipment Recommendations  Rolling walker with 5" wheels    Recommendations for Other Services       Precautions / Restrictions Precautions Precautions: Fall Restrictions Weight Bearing Restrictions: No    Mobility  Bed Mobility Overal bed mobility: Needs Assistance Bed Mobility: Supine to Sit     Supine to sit: Min guard;HOB elevated     General bed mobility comments: Min guard for supine to sit for safety, pt with use of bedrails and increased time.  Transfers Overall transfer level: Needs assistance Equipment used: Rolling walker (2 wheeled) Transfers: Sit to/from Stand Sit to Stand: Min guard         General transfer comment: cues for safe transition position and use of UEs to self assist  Ambulation/Gait Ambulation/Gait assistance: Min guard Gait Distance (Feet): 47 Feet Assistive device: Rolling walker (2 wheeled) Gait Pattern/deviations: Step-through pattern;Decreased stride length;Trunk flexed Gait velocity: very decreased, guarded    General Gait Details: Min guard for safety. Pt with short-step, shuffling-like gait during ambulation. Verbal  cuing for placement in RW.   Multiple short standing rest breaks required to complete task   Stairs             Wheelchair Mobility    Modified Rankin (Stroke Patients Only)       Balance Overall balance assessment: Needs assistance Sitting-balance support: No upper extremity supported;Feet supported Sitting balance-Leahy Scale: Good     Standing balance support: Bilateral upper extremity supported Standing balance-Leahy Scale: Poor Standing balance comment: reliant on UE support in standing                            Cognition Arousal/Alertness: Awake/alert Behavior During Therapy: WFL for tasks assessed/performed Overall Cognitive Status: Within Functional Limits for tasks assessed                                        Exercises      General Comments        Pertinent Vitals/Pain Pain Assessment: 0-10 Pain Score: 3  Pain Location: bil feet/legs with mobility  Pain Descriptors / Indicators: Sore Pain Intervention(s): Limited activity within patient's tolerance;Monitored during session    Home Living                      Prior Function            PT Goals (current goals can now be found in the care plan section) Acute Rehab PT Goals Patient Stated Goal:  none stated with PT PT Goal Formulation: With patient Time For Goal Achievement: 10/22/18 Potential to Achieve Goals: Good Progress towards PT goals: Progressing toward goals    Frequency    Min 3X/week      PT Plan Current plan remains appropriate    Co-evaluation              AM-PAC PT "6 Clicks" Mobility   Outcome Measure  Help needed turning from your back to your side while in a flat bed without using bedrails?: None Help needed moving from lying on your back to sitting on the side of a flat bed without using bedrails?: A Little Help needed moving to and from a bed to a chair (including a wheelchair)?: A Little Help needed standing up from  a chair using your arms (e.g., wheelchair or bedside chair)?: A Little Help needed to walk in hospital room?: A Little Help needed climbing 3-5 steps with a railing? : A Lot 6 Click Score: 18    End of Session Equipment Utilized During Treatment: Gait belt Activity Tolerance: Patient limited by fatigue Patient left: in chair;with call bell/phone within reach;with chair alarm set Nurse Communication: Mobility status PT Visit Diagnosis: Other abnormalities of gait and mobility (R26.89);Muscle weakness (generalized) (M62.81);Difficulty in walking, not elsewhere classified (R26.2)     Time: 5427-0623 PT Time Calculation (min) (ACUTE ONLY): 27 min  Charges:  $Gait Training: 23-37 mins                     [place] Pager [phone] Office [phone]    [place] 10/10/2018, 12:40 PM

## 2018-10-10 NOTE — Discharge Summary (Signed)
Physician Discharge Summary  Jasmine Horton WEX:[id number] DOB: [birth year] DOA: 10/07/2018  PCP: Lawerance Cruel, MD  Admit date: 10/07/2018 Discharge date: 10/10/2018  Admitted From: home  Disposition:  Home   Recommendations for Outpatient Follow-up:  1. Follow up with PCP in 1-2 weeks 2. Cancer center will call you with results and follow-up.  Home Health: PT OT RN Equipment/Devices: None  Discharge Condition: Stable CODE STATUS: Full code Diet recommendation: Cardiac diet  Brief/Interim Summary: 85 year old female with history of hyperlipidemia, hypertension and asthmatic bronchitis, previous smoker who presented to the hospital with cellulitis and weeping wounds on her lower extremities.  On ER evaluation, patient was found to have palpable abdominal mass.  CT scan also showed chest lymphadenopathy. Patient with protein calorie malnutrition, advanced COPD, she does not follow-up regularly.  Patient had been noticing some abdominal mass for last few years and she has not followed up.  Patient was also not taking any blood pressure medicine at home.  She was found with multiple medical problems that were treated as below.  Discharge Diagnoses:  Principal Problem:   Cellulitis Active Problems:   Essential hypertension   Panlobular emphysema (HCC)   AKI (acute kidney injury) ([place])   Venous stasis   Malnutrition ([place])   Peripheral edema   Metastatic cancer to liver [facility])  Bilateral Leg cellulitis:  Patient has bilateral venous stasis ulcers and few open wounds. Seen by wound care.  Will need ongoing wound care at home. Continue Haematologist.  Mobilize. Doxycycline 50 mg twice a day for 7 days.  Elevate affected legs.  Abdominal mass /multiple lymphadenopathy /possible metastatic cancer to liver: Patient was found to have malignant lesion on her liver, multiple lymphadenopathy and also with intrathoracic lymphadenopathies.  Probably metastatic cancer.   Patient underwent biopsy  of the liver lesion on 10/09/2018.  Results are pending.  Elevated CA 19.   Multiple family members with breast cancer including sister and daughter.   Patient will go home today.  Biopsy results will be called by oncology clinic. The results were discussed with patient and her daughter.  This was possibly a malignant tumor.  The treatment strategies if any will be discussed as outpatient in the office.  Abnormal aortic valve:  Echocardiogram showed abnormal aortic valve leaflet.  This was discussed with cardiology.  They do not think this is vegetation.  Blood cultures no growth so far.  No clinical evidence of endocarditis.    Moderate protein calorie malnutrition: seen by nutrition.  Encourage high-protein diet at home.  Renal functions has improved.  She has mildly elevated blood pressures, she is not taking any blood pressure medicine at home.  There is no indication to start antihypertensives at this time.  Discharge Instructions  Discharge Instructions    Call MD for:  redness, tenderness, or signs of infection (pain, swelling, redness, odor or green/yellow discharge around incision site)   Complete by:  As directed    Call MD for:  severe uncontrolled pain   Complete by:  As directed    Call MD for:  temperature >100.4   Complete by:  As directed    Diet - low sodium heart healthy   Complete by:  As directed    Increase activity slowly   Complete by:  As directed      Allergies as of 10/10/2018      Reactions   Penicillins Other (See Comments)   Childhood allergy      Medication List  TAKE these medications   albuterol 108 (90 Base) MCG/ACT inhaler Commonly known as:  VENTOLIN HFA Inhale 2 puffs into the lungs every 6 (six) hours as needed for wheezing or shortness of breath.   doxycycline 50 MG capsule Commonly known as:  VIBRAMYCIN Take 1 capsule (50 mg total) by mouth 2 (two) times daily for 10 days.       Allergies  Allergen Reactions  . Penicillins  Other (See Comments)    Childhood allergy    Consultations:  Interventional radiology  Surgery  Oncology  Wound care   Procedures/Studies: Ct Abdomen Pelvis Wo Contrast  Result Date: 10/08/2018 CLINICAL DATA:  85 year old female with palpable abdominal mass. Lower extremity swelling. EXAM: CT ABDOMEN AND PELVIS WITHOUT CONTRAST TECHNIQUE: Multidetector CT imaging of the abdomen and pelvis was performed following the standard protocol without IV contrast. COMPARISON:  CTA chest 02/26/2015 FINDINGS: Lower chest: Negative.  No pericardial or pleural effusion. Hepatobiliary: Trace perihepatic free fluid. Negative noncontrast liver. The gallbladder is visible on series 2, image 29 and is partially inseparable from a large lobulated right abdominal mass. The mass epicenter is in the right abdominal mesentery and tracks from the inferior liver edge to the pelvic inlet encompassing 93 by 148 by 118 millimeters (AP by transverse by CC). The mass is inseparable from multiple bowel loops, and might be contiguous with a bowel lumen on series 2, image 41 (versus necrotic/cavitary changes within the mass. Small volume superimposed right gutter free fluid. Pancreas: Atrophied and difficult to delineate. Spleen: Negative. Adrenals/Urinary Tract: Both adrenal glands are difficult to delineate. The kidneys are nonobstructed. No nephrolithiasis. Diminutive and unremarkable urinary bladder. Stomach/Bowel: There is a small volume of oral contrast in the stomach and a larger volume of oral contrast in distal small bowel loops throughout the lower abdomen and pelvis. These small bowel loops define the caudal extent of the large lobulated abdominal mass described earlier on series 2, image 47. The mass appears to be intraperitoneal. The right colon and hepatic flexure are poorly delineated. The transverse colon is redundant. There are no dilated bowel loops. No free intraperitoneal air identified. Vascular/Lymphatic:  Aortoiliac calcified atherosclerosis. 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 However, there is also abnormal nodular soft tissue bordering the aorta which renders some of the aortic contours difficult to delineate. There is chronic ectasia of the descending thoracic aorta and aorta at the hiatus of 35 millimeters. Vascular patency is not evaluated in the absence of IV contrast. Retrocrural and porta hepatis lymphadenopathy. Some of the individual nodes are difficult to delineate, but enlarged nodes are at least 11-17 millimeters short axis. Reproductive: Surgically absent uterus. There may be small ovaries along both pelvic sidewalls on series 2, image 49, uncertain. Other: Questionable trace pelvic free fluid on series 2, image 58. Musculoskeletal: No acute or suspicious osseous lesion. IMPRESSION: 1. Large, lobulated 15 centimeter mass located in the right abdomen is inseparable from large bowel as well as the gallbladder and inferior liver edge. The epicenter is within the right abdominal mesentery. A portion of the mass may be necrotic and/or communicate with the bowel lumen (series 2, image 41). In conjunction with lymphadenopathy (see #2) the top differential considerations are Lymphoma (slightly favored), colon carcinoma, and less likely ovarian carcinoma. If the patient cannot receive CT IV contrast for better delineation then Abdomen MRI may be valuable. 2. Retrocrural, retroperitoneal, and right upper quadrant lymphadenopathy - some of which is  inseparable from a juxta renal aortic aneurysm (see #3). 3. 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 Recommend vascular surgery consultation. 4. Small volume free fluid. No definite free air. No bowel obstruction. Electronically Signed   By: Genevie Ann M.D.   On: 10/08/2018 06:41   Dg Chest 2 View  Result Date:  10/07/2018 CLINICAL DATA:  Lower extremity pain and swelling for months, worse over the past 4 days. EXAM: CHEST - 2 VIEW COMPARISON:  CT chest and PA and lateral chest 02/26/2015. FINDINGS: The lungs are emphysematous but clear. Heart size is normal. No pneumothorax or pleural effusion. Aortic atherosclerosis noted. No acute or focal bony abnormality. IMPRESSION: Emphysema without acute disease. Atherosclerosis. Electronically Signed   By: Inge Rise M.D.   On: 10/07/2018 21:42   Ct Chest Wo Contrast  Result Date: 10/08/2018 CLINICAL DATA:  New abdominal mass. EXAM: CT CHEST WITHOUT CONTRAST TECHNIQUE: Multidetector CT imaging of the chest was performed following the standard protocol without IV contrast. COMPARISON:  CT chest dated February 26, 2015. FINDINGS: Cardiovascular: Normal heart size. No pericardial effusion. Coronary, aortic arch, and branch vessel atherosclerotic vascular disease. Localized saccular dilatations along the posterior aspect of the proximal and mid descending aorta have mildly increased in size since 2016. For example, the dilatation along the mid descending thoracic aorta previously measured 1.0 x 1.4 x 1.2 cm, and now measures 1.0 x 1.7 x 1.6 cm. Similarly, focal rightward saccular dilatation along the distal descending thoracic aorta has also increased in size, now measuring 3.9 x 3.2 cm, previously 2.5 x 2.1 cm. Mediastinum/Nodes: Enlarged lower periaortic lymph nodes measuring up to 1.3 cm in short axis. No enlarged mediastinal or axillary lymph nodes. Thyroid gland, trachea, and esophagus demonstrate no significant findings. Lungs/Pleura: Severe centrilobular emphysema. New linear, slightly nodular density in the posterior right upper lobe is favored to reflect scarring. Unchanged scarring in the left lower lobe. New 6 mm slightly spiculated nodule in the left upper lobe (series 5, image 80). New 8 x 7 mm spiculated nodule in the right lower lobe (series 5, image 134). No  focal consolidation, pleural effusion, or pneumothorax. Upper Abdomen: Please see separate CT abdomen pelvis report from same day. Musculoskeletal: No chest Mickel mass or suspicious bone lesions identified. IMPRESSION: 1. Lower para-aortic lymphadenopathy measuring up to 1.3 cm. 2. New 8 mm right lower lobe and 6 mm left upper lobe spiculated nodules. These could represent metastases or synchronous primary bronchogenic carcinomas. Follow-up non-contrast chest CT at 3-6 months is recommended. 3. Enlarging saccular aneurysm versus penetrating ulcer along the distal descending thoracic aorta, measuring 3.9 x 3.2 cm, previously 2.5 x 2.1 cm. Additional focal saccular dilatations along the proximal and mid descending aorta have also mildly increased in size. Evaluation is limited without intravenous contrast. 4. Aortic atherosclerosis (ICD10-I70.0). 5. Emphysema (ICD10-J43.9). Electronically Signed   By: Titus Dubin M.D.   On: 10/08/2018 19:52   US Biopsy (liver)  Result Date: 10/09/2018 INDICATION: 85 year old with large abdominal mass, lymphadenopathy and probable liver lesion. Patient needs a tissue diagnosis. EXAM: ULTRASOUND-GUIDED LIVER LESION BIOPSY MEDICATIONS: None. ANESTHESIA/SEDATION: Moderate (conscious) sedation was employed during this procedure. A total of Versed 1.0 mg and Fentanyl 50 mcg was administered intravenously. Moderate Sedation Time: 11 minutes. The patient's level of consciousness and vital signs were monitored continuously by radiology nursing throughout the procedure under my direct supervision. FLUOROSCOPY TIME:  None COMPLICATIONS: None immediate. PROCEDURE: Abdomen was evaluated with  ultrasound. A liver lesion was selected for percutaneous biopsy. Informed written consent was obtained from the patient after a thorough discussion of the procedural risks, benefits and alternatives. All questions were addressed. Maximal Sterile Barrier Technique was utilized including caps, mask,  sterile gowns, sterile gloves, sterile drape, hand hygiene and skin antiseptic. A timeout was performed prior to the initiation of the procedure. Anterior right abdomen was prepped with chlorhexidine and sterile field was created. Skin and soft tissues anesthetized with 1% lidocaine. Using ultrasound guidance, 17 gauge coaxial needle directed into the liver lesion. Total of 4 core biopsies were obtained with an 18 gauge core device. Specimens placed in saline. 17 gauge needle was removed without complication. Bandage placed over the puncture site. FINDINGS: Large palpable soft tissue mass in the anterior abdomen. This mass corresponds with the recent CT findings. Not clear if the abdominal mass is associated with bowel. In addition, there is a hypoechoic lesion in the right hepatic lobe. Needle position confirmed within this hepatic lesion. Four core biopsies were obtained. Specimens placed in saline. No evidence for bleeding or hematoma formation following the hepatic lesion biopsy. Trace perihepatic ascites along the right lateral abdomen. Multiple enlarged upper abdominal lymph nodes. IMPRESSION: Ultrasound-guided core biopsies of a hepatic lesion. Electronically Signed   By: Markus Daft M.D.   On: 10/09/2018 13:27   Vas [place] Lower Extremity Venous (dvt)  Result Date: 10/08/2018  Lower Venous Study Indications: Pain.  Performing Technologist: Oliver Hum RVT  Examination Guidelines: A complete evaluation includes B-mode imaging, spectral Doppler, color Doppler, and power Doppler as needed of all accessible portions of each vessel. Bilateral testing is considered an integral part of a complete examination. Limited examinations for reoccurring indications may be performed as noted.  +---------+---------------+---------+-----------+----------+-------+ RIGHT    CompressibilityPhasicitySpontaneityPropertiesSummary +---------+---------------+---------+-----------+----------+-------+ CFV      Full            Yes      Yes                          +---------+---------------+---------+-----------+----------+-------+ SFJ      Full                                                 +---------+---------------+---------+-----------+----------+-------+ FV Prox  Full                                                 +---------+---------------+---------+-----------+----------+-------+ FV Mid   Full                                                 +---------+---------------+---------+-----------+----------+-------+ FV DistalFull                                                 +---------+---------------+---------+-----------+----------+-------+ PFV      Full                                                 +---------+---------------+---------+-----------+----------+-------+  POP      Full           Yes      Yes                          +---------+---------------+---------+-----------+----------+-------+ PTV      Full                                                 +---------+---------------+---------+-----------+----------+-------+ PERO     Full                                                 +---------+---------------+---------+-----------+----------+-------+   +---------+---------------+---------+-----------+----------+-------+ LEFT     CompressibilityPhasicitySpontaneityPropertiesSummary +---------+---------------+---------+-----------+----------+-------+ CFV      Full           Yes      Yes                          +---------+---------------+---------+-----------+----------+-------+ SFJ      Full                                                 +---------+---------------+---------+-----------+----------+-------+ FV Prox  Full                                                 +---------+---------------+---------+-----------+----------+-------+ FV Mid   Full                                                  +---------+---------------+---------+-----------+----------+-------+ FV DistalFull                                                 +---------+---------------+---------+-----------+----------+-------+ PFV      Full                                                 +---------+---------------+---------+-----------+----------+-------+ POP      Full           Yes      Yes                          +---------+---------------+---------+-----------+----------+-------+ PTV      Full                                                 +---------+---------------+---------+-----------+----------+-------+ PERO  Full                                                 +---------+---------------+---------+-----------+----------+-------+     Summary: Right: There is no evidence of deep vein thrombosis in the lower extremity. No cystic structure found in the popliteal fossa. Left: There is no evidence of deep vein thrombosis in the lower extremity. No cystic structure found in the popliteal fossa.  *See table(s) above for measurements and observations. Electronically signed by Deitra Mayo MD on 10/08/2018 at 7:02:13 PM.    Final       Subjective: Patient was seen and examined today.  She had no pain.  No complaints.  Does not have a great appetite.  Denies any problems after procedure on her abdomen. Try to call her son, not able to talk.  Updated her daughter.   Discharge Exam: Vitals:   10/09/18 2155 10/10/18 0419  BP: (!) 148/85 (!) 151/84  Pulse: 74 67  Resp: 20 20  Temp: 98 F (36.7 C) 98.3 F (36.8 C)  SpO2: 93% 91%   Vitals:   10/09/18 1320 10/09/18 1500 10/09/18 2155 10/10/18 0419  BP: 109/78  (!) 148/85 (!) 151/84  Pulse: 82  74 67  Resp: _0 Temp: 98.4 F (36.9 C)  98 F (36.7 C) 98.3 F (36.8 C)  TempSrc: Oral  Oral Oral  SpO2: 95% 95% 93% 91%  Weight:      Height:        General: Pt is alert, awake, not in acute distress, patient is cachectic.   Frail looking.  Cardiovascular: RRR, S1/S2 +, no rubs, no gallops Respiratory: CTA bilaterally, no wheezing, no rhonchi Abdominal: Soft, NT, ND, bowel sounds +, firm palpable mass at right lateral quadrant.  Extremities: no edema, no cyanosis Bilateral venous stasis ulcers with few open wounds, pictures are available in epic. Unna boot applied.    The results of significant diagnostics from this hospitalization (including imaging, microbiology, ancillary and laboratory) are listed below for reference.     Microbiology: Recent Results (from the past 240 hour(s))  SARS Coronavirus 2 (CEPHEID - Performed in [place] hospital lab), Hosp Order     Status: None   Collection Time: 10/07/18  8:59 PM  Result Value Ref Range Status   SARS Coronavirus 2 NEGATIVE NEGATIVE Final    Comment: (NOTE) If result is NEGATIVE SARS-CoV-2 target nucleic acids are NOT DETECTED. The SARS-CoV-2 RNA is generally detectable in upper and lower  respiratory specimens during the acute phase of infection. The lowest  concentration of SARS-CoV-2 viral copies this assay can detect is 250  copies / mL. A negative result does not preclude SARS-CoV-2 infection  and should not be used as the sole basis for treatment or other  patient management decisions.  A negative result may occur with  improper specimen collection / handling, submission of specimen other  than nasopharyngeal swab, presence of viral mutation(s) within the  areas targeted by this assay, and inadequate number of viral copies  (<250 copies / mL). A negative result must be combined with clinical  observations, patient history, and epidemiological information. If result is POSITIVE SARS-CoV-2 target nucleic acids are DETECTED. The SARS-CoV-2 RNA is generally detectable in upper and lower  respiratory specimens dur ing the acute phase of infection.  Positive  results  are indicative of active infection with SARS-CoV-2.  Clinical  correlation with  patient history and other diagnostic information is  necessary to determine patient infection status.  Positive results do  not rule out bacterial infection or co-infection with other viruses. If result is PRESUMPTIVE POSTIVE SARS-CoV-2 nucleic acids MAY BE PRESENT.   A presumptive positive result was obtained on the submitted specimen  and confirmed on repeat testing.  While 2019 novel coronavirus  (SARS-CoV-2) nucleic acids may be present in the submitted sample  additional confirmatory testing may be necessary for epidemiological  and / or clinical management purposes  to differentiate between  SARS-CoV-2 and other Sarbecovirus currently known to infect humans.  If clinically indicated additional testing with an alternate test  methodology [id number]) is advised. The SARS-CoV-2 RNA is generally  detectable in upper and lower respiratory sp ecimens during the acute  phase of infection. The expected result is Negative. Fact Sheet for Patients:  StrictlyIdeas.no Fact Sheet for Healthcare Providers: BankingDealers.co.za This test is not yet approved or cleared by the [place] FDA and has been authorized for detection and/or diagnosis of SARS-CoV-2 by FDA under an Emergency Use Authorization (EUA).  This EUA will remain in effect (meaning this test can be used) for the duration of the COVID-19 declaration under Section 564(b)(1) of the Act, 21 U.S.C. section 360bbb-3(b)(1), unless the authorization is terminated or revoked sooner. Performed at [facility], [place] [address], [place], [place] [address]   MRSA PCR Screening     Status: Abnormal   Collection Time: 10/08/18  2:43 AM  Result Value Ref Range Status   MRSA by PCR POSITIVE (A) NEGATIVE Final    Comment:        The GeneXpert MRSA Assay (FDA approved for NASAL specimens only), is one component of a comprehensive MRSA colonization surveillance program. It is  not intended to diagnose MRSA infection nor to guide or monitor treatment for MRSA infections. CRITICAL RESULT CALLED TO, READ BACK BY AND VERIFIED WITH: RN Doristine Johns AT [id number] 10/08/18 [place] A Performed at [facility], [place] [address] [place], [place] [address]      Labs: BNP (last 3 results) Recent Labs    10/07/18 2044  BNP 578.4*   Basic Metabolic Panel: Recent Labs  Lab 10/07/18 2044 10/08/18 0100 10/08/18 0437 10/10/18 0528  NA 135  --  135 137  K 5.0  --  4.3 4.1  CL 99  --  100 110  CO2 25  --  25 21*  GLUCOSE 105*  --  99 78  BUN 54*  --  53* 26*  CREATININE 2.05*  --  1.88* 1.15*  CALCIUM 8.7*  --  8.4* 8.1*  MG  --  2.0 1.9 1.7  PHOS  --   --  3.0  --    Liver Function Tests: Recent Labs  Lab 10/07/18 2044 10/08/18 0437  AST 19 15  ALT 10 10  ALKPHOS 79 75  BILITOT 0.4 0.3  PROT 6.0* 5.4*  ALBUMIN 2.9* 2.6*   No results for input(s): LIPASE, AMYLASE in the last 168 hours. No results for input(s): AMMONIA in the last 168 hours. CBC: Recent Labs  Lab 10/07/18 2044 10/08/18 0437 10/10/18 0528  WBC 15.0* 14.3* 12.6*  NEUTROABS 13.6*  --  10.4*  HGB 11.3* 10.0* 10.1*  HCT 37.4 33.6* 35.7*  MCV 86.4 87.0 90.8  PLT 287 254 254   Cardiac Enzymes: Recent Labs  Lab 10/07/18 2044  TROPONINI <0.03   BNP: Invalid input(s): POCBNP CBG: No results for input(s): GLUCAP in the last 168 hours. D-Dimer No results for input(s): DDIMER in the last 72 hours. Hgb A1c No results for input(s): HGBA1C in the last 72 hours. Lipid Profile No results for input(s): CHOL, HDL, LDLCALC, TRIG, CHOLHDL, LDLDIRECT in the last 72 hours. Thyroid function studies Recent Labs    10/08/18 0437  TSH 3.832   Anemia work up No results for input(s): VITAMINB12, FOLATE, FERRITIN, TIBC, IRON, RETICCTPCT in the last 72 hours. Urinalysis    Component Value Date/Time   COLORURINE YELLOW 10/07/2018 2314   APPEARANCEUR HAZY (A) 10/07/2018  2314   LABSPEC 1.016 10/07/2018 2314   PHURINE 5.0 10/07/2018 2314   GLUCOSEU NEGATIVE 10/07/2018 2314   HGBUR MODERATE (A) 10/07/2018 2314   BILIRUBINUR NEGATIVE 10/07/2018 2314   KETONESUR NEGATIVE 10/07/2018 2314   PROTEINUR 100 (A) 10/07/2018 2314   UROBILINOGEN 1.0 04/01/2008 1652   NITRITE NEGATIVE 10/07/2018 2314   LEUKOCYTESUR NEGATIVE 10/07/2018 2314   Sepsis Labs Invalid input(s): PROCALCITONIN,  WBC,  LACTICIDVEN Microbiology Recent Results (from the past 240 hour(s))  SARS Coronavirus 2 (CEPHEID - Performed in [place] hospital lab), Hosp Order     Status: None   Collection Time: 10/07/18  8:59 PM  Result Value Ref Range Status   SARS Coronavirus 2 NEGATIVE NEGATIVE Final    Comment: (NOTE) If result is NEGATIVE SARS-CoV-2 target nucleic acids are NOT DETECTED. The SARS-CoV-2 RNA is generally detectable in upper and lower  respiratory specimens during the acute phase of infection. The lowest  concentration of SARS-CoV-2 viral copies this assay can detect is 250  copies / mL. A negative result does not preclude SARS-CoV-2 infection  and should not be used as the sole basis for treatment or other  patient management decisions.  A negative result may occur with  improper specimen collection / handling, submission of specimen other  than nasopharyngeal swab, presence of viral mutation(s) within the  areas targeted by this assay, and inadequate number of viral copies  (<250 copies / mL). A negative result must be combined with clinical  observations, patient history, and epidemiological information. If result is POSITIVE SARS-CoV-2 target nucleic acids are DETECTED. The SARS-CoV-2 RNA is generally detectable in upper and lower  respiratory specimens dur ing the acute phase of infection.  Positive  results are indicative of active infection with SARS-CoV-2.  Clinical  correlation with patient history and other diagnostic information is  necessary to determine patient  infection status.  Positive results do  not rule out bacterial infection or co-infection with other viruses. If result is PRESUMPTIVE POSTIVE SARS-CoV-2 nucleic acids MAY BE PRESENT.   A presumptive positive result was obtained on the submitted specimen  and confirmed on repeat testing.  While 2019 novel coronavirus  (SARS-CoV-2) nucleic acids may be present in the submitted sample  additional confirmatory testing may be necessary for epidemiological  and / or clinical management purposes  to differentiate between  SARS-CoV-2 and other Sarbecovirus currently known to infect humans.  If clinically indicated additional testing with an alternate test  methodology [phone]) is advised. The SARS-CoV-2 RNA is generally  detectable in upper and lower respiratory sp ecimens during the acute  phase of infection. The expected result is Negative. Fact Sheet for Patients:  StrictlyIdeas.no Fact Sheet for Healthcare Providers: BankingDealers.co.za This test is not yet approved or cleared by the [place] FDA and has been authorized for detection and/or diagnosis of  SARS-CoV-2 by FDA under an Emergency Use Authorization (EUA).  This EUA will remain in effect (meaning this test can be used) for the duration of the COVID-19 declaration under Section 564(b)(1) of the Act, 21 U.S.C. section 360bbb-3(b)(1), unless the authorization is terminated or revoked sooner. Performed at [facility], [place] [address] [place], [place] [address]   MRSA PCR Screening     Status: Abnormal   Collection Time: 10/08/18  2:43 AM  Result Value Ref Range Status   MRSA by PCR POSITIVE (A) NEGATIVE Final    Comment:        The GeneXpert MRSA Assay (FDA approved for NASAL specimens only), is one component of a comprehensive MRSA colonization surveillance program. It is not intended to diagnose MRSA infection nor to guide or monitor treatment  for MRSA infections. CRITICAL RESULT CALLED TO, READ BACK BY AND VERIFIED WITH: RN Doristine Johns AT 1886 10/08/18 [place] A Performed at [facility], [place] [address], [place], [place] [address]      Time coordinating discharge:32  minutes  SIGNED:   Barb Merino, MD  [facility] 10/10/2018, 10:44 AM Pager [phone]  If 7PM-7AM, please contact night-coverage [email or web address] Password [email or web address]

## 2018-10-10 NOTE — TOC Transition Note (Signed)
Transition of Care [facility]) - CM/SW Discharge Note   Patient Details  Name: Jasmine Horton MRN: [id number] Date of Birth: 1933-06-17  Transition of Care [facility]) CM/SW Contact:  Dessa Phi, RN Phone Number: 10/10/2018, 12:59 PM   Clinical Narrative: [facility] already following-rep Santiago Glad aware of d/c today & HHC orders-HHRN-unna boots/PT/OT. No further Cm needs.      Final next level of care: [place] Barriers to Discharge: No Barriers Identified   Patient Goals and CMS Choice Patient states their goals for this hospitalization and ongoing recovery are:: go home CMS Medicare.gov Compare Post Acute Care list provided to:: Patient    Discharge Placement                       Discharge Plan and Services   Discharge Planning Services: CM Consult                      HH Arranged: RN, PT, OT [facility] Agency: [place] ([place]) Date [place]: 10/08/18 Time [place]: [place] Representative spoke with at [place]: [place] ([employer]) Interventions     Readmission Risk Interventions No flowsheet data found.

## 2018-10-10 NOTE — TOC Transition Note (Signed)
Transition of Care [facility]) - CM/SW Discharge Note   Patient Details  Name: Jasmine Horton MRN: [id number] Date of Birth: [birth year]  Transition of Care [facility]) CM/SW Contact:  Dessa Phi, RN Phone Number: 10/10/2018, 1:19 PM   Clinical Narrative:  Per nsg patient for d/c home by PTAR-nsg will manage forms & calling [facility]. No CM needs.     Final next level of care: [place] Barriers to Discharge: No Barriers Identified   Patient Goals and CMS Choice Patient states their goals for this hospitalization and ongoing recovery are:: go home CMS Medicare.gov Compare Post Acute Care list provided to:: Patient    Discharge Placement                       Discharge Plan and Services   Discharge Planning Services: CM Consult                      HH Arranged: RN, PT, OT [facility] Agency: [place] ([place]) Date [place]: 10/08/18 Time [place]: [place] Ne[place]on Torres Representative spoke with at [place]: [place] ([place]) Interventions     Readmission Risk Interventions No flowsheet data found.

## 2018-10-11 ENCOUNTER — Telehealth: Payer: Self-pay | Admitting: Oncology

## 2018-10-11 ENCOUNTER — Telehealth: Payer: Self-pay | Admitting: *Deleted

## 2018-10-11 ENCOUNTER — Telehealth: Payer: Self-pay

## 2018-10-11 DIAGNOSIS — C787 Secondary malignant neoplasm of liver and intrahepatic bile duct: Secondary | ICD-10-CM

## 2018-10-11 NOTE — Telephone Encounter (Signed)
Call placed to the patient to discuss biopsy results. I informed her of the results and recommended a follow up appointment at the [facility] with one of our GI oncologists. She is agreeable. The patient attempted to get her son to the phone as well, but he was not home. She asked that I not call her daughter. The patient asked that we call her with the date/time of the appt. I have referred this patient to [facility], RN to arrange for a new pt appt.   Mikey Bussing, DNP, AGPCNP-BC, AOCNP

## 2018-10-11 NOTE — Telephone Encounter (Signed)
Called patient to advise of consult with Dr. Burr Medico on 5/21 @ 2:45 PM. Patient states that she cannot get in a car because of the copious weeping from her legs.  She does not have a smart phone, she has a son which she said can't help her. A WebEx is not a possibility. Message sent to Alphonzo Grieve- practice administrator asking for guidance.

## 2018-10-11 NOTE — Telephone Encounter (Signed)
Received call from Ronny Bacon, [place] with [facility] requesting orders for pt's home care including skilled nurse visits, UNNA boots to bilateral lower extremities for cellulitis. Informed her that pt was seen in the hospital for a consultation by Dr. Julien Nordmann.  The patient has not been seen in the clinic yet.  The home health orders will need to be signed by her PCP. Ronny Bacon voiced understanding and will call Dr. Harrington Challenger' office for orders.

## 2018-10-14 ENCOUNTER — Telehealth: Payer: Self-pay

## 2018-10-14 LAB — CULTURE, BLOOD (ROUTINE X 2)
Culture: NO GROWTH
Culture: NO GROWTH

## 2018-10-14 NOTE — Telephone Encounter (Signed)
Called patient to advise that her appointment can be done over the telephone. She said that her son will be with her. She has my number for questions or concerns.

## 2018-10-15 ENCOUNTER — Telehealth: Payer: Self-pay

## 2018-10-15 ENCOUNTER — Telehealth: Payer: Self-pay | Admitting: *Deleted

## 2018-10-15 NOTE — Telephone Encounter (Signed)
Dawn, please see the message below. Please call her son, to see if he wants to move her appointment to tomorrow 5/20, I am available to call them (her son needs to be available) at 12:30 or 4pm tomorrow.   Truitt Merle MD

## 2018-10-15 NOTE — Telephone Encounter (Signed)
Called patient to reinforce that Dr. Burr Medico will call her on the telephone for her initial consult on 5/21 around 2:45 in the afternoon. She again said that her son would be with her. She has my number to call with questions or concerns.

## 2018-10-15 NOTE — Telephone Encounter (Signed)
Received vm call from pt stating that she doesn't know what to do b/c she can't drive & has seepage from her legs & she has an appt @ 2:45 pm on 10/16/08. Returned call & left message that per [facility] RN/Navigator, this appt can be done via phone & to have her phone available that day & to call back if further questions.

## 2018-10-15 NOTE — Telephone Encounter (Signed)
error 

## 2018-10-15 NOTE — Telephone Encounter (Addendum)
"  Jasmine Horton [phone]).  I am to see Dr. Burr Medico Thursday, 10-17-2018 but this is too far away.  Both legs are swelling, weeping and uncomfortable.  Cannot elevate legs; Cannot breath with legs elevated.  Cannot lie flat.  I'm sitting on the couch and cannot elevate legs on couch.  No chest pain, sweating, palpitations.  Take doxycyline, nothing for pain.  [facility] came out, undressed my legs and no one has come back to see me.  Legs looked better when unwrapped but now so swollen I cannot wear shoes, get up or move.  I do not have a cardiologist, pulmonologist or PCP.  Have not seen Dr. Harrington Challenger or any doctor in years before ending up in the Hospital."  Instructed to report to ED for evaluation.  Denies PCP.   Reviewed signs of respiratory distress and distress to report to ED. Denies distress.  Continues to say "10-17-2018 appointment is too far out".  Reviewed Hem/Onc specialty, cellulitis leg management, FF, elevate legs without lying flat and more.   Jasmine Horton says she will call [place] per letter provided to her [phone]) if needs further assistance.  Conveyed this nurse will notify new patient coordinator of request for appointment change.    FYI Asked for confirmation of caller ID number as best return call number.  Denies this number, "may be old land number".  Unable to provide son's number,audible with call "Don't use this number".  Difficult time recall but provided [phone] as her mobile number.

## 2018-10-16 NOTE — Progress Notes (Addendum)
[place]   [phone] Fax:([phone]) [place] Note   Patient Care Team: Christa See, FNP as PCP - General (Family Medicine)  I connected with Jonathon Resides on 10/17/2018 at  2:45 PM EDT by telephone visit and verified that I am speaking with the correct person using two identifiers.   I discussed the limitations, risks, security and privacy concerns of performing an evaluation and management service by telephone and the availability of in person appointments. I also discussed with the patient that there may be a patient responsible charge related to this service. The patient expressed understanding and agreed to proceed.   Patient's location:  Her home  Provider's location:  My Office  CHIEF COMPLAINTS/PURPOSE OF CONSULTATION:  Metastatic colon cancer to Liver      Metastatic colon cancer to liver ([place])   10/08/2018 Imaging    CT CAP 10/08/18 IMPRESSION: 1. Large, lobulated 15 centimeter mass located in the right abdomen is inseparable from large bowel as well as the gallbladder and inferior liver edge. The epicenter is within the right abdominal mesentery. A portion of the mass may be necrotic and/or communicate with the bowel lumen (series 2, image 41). In conjunction with lymphadenopathy (see #2) the top differential considerations are Lymphoma (slightly favored), colon carcinoma, and less likely ovarian carcinoma. If the patient cannot receive CT IV contrast for better delineation then Abdomen MRI may be valuable. 2. Retrocrural, retroperitoneal, and right upper quadrant lymphadenopathy - some of which is inseparable from a juxta renal aortic aneurysm (see #3). 3. 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 Recommend vascular surgery consultation. 4. Small volume free fluid. No definite free air. No bowel obstruction.    10/09/2018 Initial  Diagnosis    Metastatic colon cancer to liver ([place])    10/09/2018 Initial Biopsy    Diagnosis 10/09/18 Liver, needle/core biopsy - POORLY DIFFERENTIATED CARCINOMA. - SEE MICROSCOPIC DESCRIPTION.      HISTORY OF PRESENTING ILLNESS:  Jasmine Horton 85 y.o. female is a here because of Metastatic cancer to liver. The patient was referred by Hospitalist after recent discharge. Pt was not able to come in to our clinic due to her severe leg edema, and not able to do a virtual visit with on line service or smart phone. Her son was not there during the visit.   She noticed a lump in her abdomen a few years ago, which she thought was a hernia. This grew and became a huge bulge on her right abdomen. She presented to the ED on 09/27/18 with Peripheral edema and pain of right abdomen. She had CT scan and biopsy which showed cancer from a different primary.   Her legs have been swelling for 6-7 months or more but she is not sure. She ambulates still without assistance but by shuffling. She feels steady once she stands up. She declined using a cane walker. She notes she has not had a colonoscopy.  Socially her son lives with her and her daughter lives in town. She notes she is able to do ADL and self care. She is able to walk to bathroom. She is mainly sitting. Her son does cooking and other activities for her. She notes her son is an Clinical biochemist with flexible schedule. She feels her son is not medically inclined to help her constantly and is interested in being in the hospital.   They have a PMHx of COPD,  no on treatment. She notes she may have HTN. She has not been seen by a physician in years and is not privy to other comorbidities. She notes she has found a NP to follow her and plans to see her tomorrow. She had an appendectomy years ago. She is only on doxycycline.    REVIEW OF SYSTEMS:    Constitutional: Denies fevers, chills or abnormal night sweats Eyes: Denies blurriness of vision, double vision or  watery eyes Ears, nose, mouth, throat, and face: Denies mucositis or sore throat Respiratory: Denies cough, dyspnea or wheezes Cardiovascular: Denies palpitation, chest discomfort or lower extremity swelling Gastrointestinal:  Denies nausea, heartburn or change in bowel habits (+) right abdominal pain, manageable Skin: Denies abnormal skin rashes Lymphatics: Denies new lymphadenopathy or easy bruising Neurological:Denies numbness, tingling or new weaknesses Behavioral/Psych: Mood is stable, no new changes  All other systems were reviewed with the patient and are negative.   MEDICAL HISTORY:  Past Medical History:  Diagnosis Date   COPD (chronic obstructive pulmonary disease) ([place])    Hyperlipidemia    Hypertension    Metastatic cancer to liver ([place]) 10/09/2018   Seasonal allergies     SURGICAL HISTORY: Past Surgical History:  Procedure Laterality Date   APPENDECTOMY  1953   CATARACT EXTRACTION Left 02/2008   TOTAL ABDOMINAL HYSTERECTOMY  1975    SOCIAL HISTORY: Social History   Socioeconomic History   Marital status: Divorced    Spouse name: Not on file   Number of children: 3   Years of education: Not on file   Highest education level: Not on file  Occupational History   Not on file  Social Needs   Financial resource strain: Not on file   Food insecurity:    Worry: Not on file    Inability: Not on file   Transportation needs:    Medical: Not on file    Non-medical: Not on file  Tobacco Use   Smoking status: Former Smoker    Packs/day: 0.50    Years: 30.00    Pack years: 15.00    Types: Cigarettes    Last attempt to quit: 04/15/2014    Years since quitting: 4.5   Smokeless tobacco: Never Used  Substance and Sexual Activity   Alcohol use: Not Currently   Drug use: No   Sexual activity: Not on file  Lifestyle   Physical activity:    Days per week: Not on file    Minutes per session: Not on file   Stress: Not on file  Relationships    Social connections:    Talks on phone: Not on file    Gets together: Not on file    Attends religious service: Not on file    Active member of club or organization: Not on file    Attends meetings of clubs or organizations: Not on file    Relationship status: Not on file   Intimate partner violence:    Fear of current or ex partner: Not on file    Emotionally abused: Not on file    Physically abused: Not on file    Forced sexual activity: Not on file  Other Topics Concern   Not on file  Social History Narrative   Lives with son    FAMILY HISTORY: Family History  Problem Relation Age of Onset   Dementia Mother    Emphysema Father    Breast cancer Daughter    CAD Neg Hx    Diabetes Neg Hx  Stroke Neg Hx     ALLERGIES:  is allergic to penicillins.  MEDICATIONS:  Current Outpatient Medications  Medication Sig Dispense Refill   albuterol (VENTOLIN HFA) 108 (90 Base) MCG/ACT inhaler Inhale 2 puffs into the lungs every 6 (six) hours as needed for wheezing or shortness of breath. 1 Inhaler 2   doxycycline (VIBRAMYCIN) 50 MG capsule Take 1 capsule (50 mg total) by mouth 2 (two) times daily for 10 days. 20 capsule 0   No current facility-administered medications for this visit.     PHYSICAL EXAMINATION: ECOG PERFORMANCE STATUS: 3 - Symptomatic, >50% confined to bed  No Vitals or exam done today   LABORATORY DATA:  I have reviewed the data as listed CBC Latest Ref Rng & Units 10/10/2018 10/08/2018 10/07/2018  WBC 4.0 - 10.5 K/uL 12.6(H) 14.3(H) 15.0(H)  Hemoglobin 12.0 - 15.0 g/dL 10.1(L) 10.0(L) 11.3(L)  Hematocrit 36.0 - 46.0 % 35.7(L) 33.6(L) 37.4  Platelets 150 - 400 K/uL 254 254 287    CMP Latest Ref Rng & Units 10/10/2018 10/08/2018 10/07/2018  Glucose 70 - 99 mg/dL 78 99 105(H)  BUN 8 - 23 mg/dL 26(H) 53(H) 54(H)  Creatinine 0.44 - 1.00 mg/dL 1.15(H) 1.88(H) 2.05(H)  Sodium 135 - 145 mmol/L 137 135 135  Potassium 3.5 - 5.1 mmol/L 4.1 4.3 5.0  Chloride  98 - 111 mmol/L 110 100 99  CO2 22 - 32 mmol/L 21(L) 25 25  Calcium 8.9 - 10.3 mg/dL 8.1(L) 8.4(L) 8.7(L)  Total Protein 6.5 - 8.1 g/dL - 5.4(L) 6.0(L)  Total Bilirubin 0.3 - 1.2 mg/dL - 0.3 0.4  Alkaline Phos 38 - 126 U/L - 75 79  AST 15 - 41 U/L - 15 19  ALT 0 - 44 U/L - 10 10   RADIOGRAPHIC STUDIES: I have personally reviewed the radiological images as listed and agreed with the findings in the report. Ct Abdomen Pelvis Wo Contrast  Result Date: 10/08/2018 CLINICAL DATA:  85 year old female with palpable abdominal mass. Lower extremity swelling. EXAM: CT ABDOMEN AND PELVIS WITHOUT CONTRAST TECHNIQUE: Multidetector CT imaging of the abdomen and pelvis was performed following the standard protocol without IV contrast. COMPARISON:  CTA chest 02/26/2015 FINDINGS: Lower chest: Negative.  No pericardial or pleural effusion. Hepatobiliary: Trace perihepatic free fluid. Negative noncontrast liver. The gallbladder is visible on series 2, image 29 and is partially inseparable from a large lobulated right abdominal mass. The mass epicenter is in the right abdominal mesentery and tracks from the inferior liver edge to the pelvic inlet encompassing 93 by 148 by 118 millimeters (AP by transverse by CC). The mass is inseparable from multiple bowel loops, and might be contiguous with a bowel lumen on series 2, image 41 (versus necrotic/cavitary changes within the mass. Small volume superimposed right gutter free fluid. Pancreas: Atrophied and difficult to delineate. Spleen: Negative. Adrenals/Urinary Tract: Both adrenal glands are difficult to delineate. The kidneys are nonobstructed. No nephrolithiasis. Diminutive and unremarkable urinary bladder. Stomach/Bowel: There is a small volume of oral contrast in the stomach and a larger volume of oral contrast in distal small bowel loops throughout the lower abdomen and pelvis. These small bowel loops define the caudal extent of the large lobulated abdominal mass  described earlier on series 2, image 47. The mass appears to be intraperitoneal. The right colon and hepatic flexure are poorly delineated. The transverse colon is redundant. There are no dilated bowel loops. No free intraperitoneal air identified. Vascular/Lymphatic: Aortoiliac calcified atherosclerosis. There is a juxta renal abdominal aortic  aneurysm (series 2, image 20) estimated at 54 millimeters diameter. However, there is also abnormal nodular soft tissue bordering the aorta which renders some of the aortic contours difficult to delineate. There is chronic ectasia of the descending thoracic aorta and aorta at the hiatus of 35 millimeters. Vascular patency is not evaluated in the absence of IV contrast. Retrocrural and porta hepatis lymphadenopathy. Some of the individual nodes are difficult to delineate, but enlarged nodes are at least 11-17 millimeters short axis. Reproductive: Surgically absent uterus. There may be small ovaries along both pelvic sidewalls on series 2, image 49, uncertain. Other: Questionable trace pelvic free fluid on series 2, image 58. Musculoskeletal: No acute or suspicious osseous lesion. IMPRESSION: 1. Large, lobulated 15 centimeter mass located in the right abdomen is inseparable from large bowel as well as the gallbladder and inferior liver edge. The epicenter is within the right abdominal mesentery. A portion of the mass may be necrotic and/or communicate with the bowel lumen (series 2, image 41). In conjunction with lymphadenopathy (see #2) the top differential considerations are Lymphoma (slightly favored), colon carcinoma, and less likely ovarian carcinoma. If the patient cannot receive CT IV contrast for better delineation then Abdomen MRI may be valuable. 2. Retrocrural, retroperitoneal, and right upper quadrant lymphadenopathy - some of which is inseparable from a juxta renal aortic aneurysm (see #3). 3. Juxta renal Abdominal Aortic Aneurysm estimated at 54 mm diameter, but  partially obscured by surrounding lymphadenopathy. Recommend vascular surgery consultation. 4. Small volume free fluid. No definite free air. No bowel obstruction. Electronically Signed   By: Genevie Ann M.D.   On: 10/08/2018 06:41   Dg Chest 2 View  Result Date: 10/07/2018 CLINICAL DATA:  Lower extremity pain and swelling for months, worse over the past 4 days. EXAM: CHEST - 2 VIEW COMPARISON:  CT chest and PA and lateral chest 02/26/2015. FINDINGS: The lungs are emphysematous but clear. Heart size is normal. No pneumothorax or pleural effusion. Aortic atherosclerosis noted. No acute or focal bony abnormality. IMPRESSION: Emphysema without acute disease. Atherosclerosis. Electronically Signed   By: Inge Rise M.D.   On: 10/07/2018 21:42   Ct Chest Wo Contrast  Result Date: 10/08/2018 CLINICAL DATA:  New abdominal mass. EXAM: CT CHEST WITHOUT CONTRAST TECHNIQUE: Multidetector CT imaging of the chest was performed following the standard protocol without IV contrast. COMPARISON:  CT chest dated February 26, 2015. FINDINGS: Cardiovascular: Normal heart size. No pericardial effusion. Coronary, aortic arch, and branch vessel atherosclerotic vascular disease. Localized saccular dilatations along the posterior aspect of the proximal and mid descending aorta have mildly increased in size since 2016. For example, the dilatation along the mid descending thoracic aorta previously measured 1.0 x 1.4 x 1.2 cm, and now measures 1.0 x 1.7 x 1.6 cm. Similarly, focal rightward saccular dilatation along the distal descending thoracic aorta has also increased in size, now measuring 3.9 x 3.2 cm, previously 2.5 x 2.1 cm. Mediastinum/Nodes: Enlarged lower periaortic lymph nodes measuring up to 1.3 cm in short axis. No enlarged mediastinal or axillary lymph nodes. Thyroid gland, trachea, and esophagus demonstrate no significant findings. Lungs/Pleura: Severe centrilobular emphysema. New linear, slightly nodular density in the  posterior right upper lobe is favored to reflect scarring. Unchanged scarring in the left lower lobe. New 6 mm slightly spiculated nodule in the left upper lobe (series 5, image 80). New 8 x 7 mm spiculated nodule in the right lower lobe (series 5, image 134). No focal consolidation, pleural effusion, or pneumothorax. Upper  Abdomen: Please see separate CT abdomen pelvis report from same day. Musculoskeletal: No chest Syverson mass or suspicious bone lesions identified. IMPRESSION: 1. Lower para-aortic lymphadenopathy measuring up to 1.3 cm. 2. New 8 mm right lower lobe and 6 mm left upper lobe spiculated nodules. These could represent metastases or synchronous primary bronchogenic carcinomas. Follow-up non-contrast chest CT at 3-6 months is recommended. 3. Enlarging saccular aneurysm versus penetrating ulcer along the distal descending thoracic aorta, measuring 3.9 x 3.2 cm, previously 2.5 x 2.1 cm. Additional focal saccular dilatations along the proximal and mid descending aorta have also mildly increased in size. Evaluation is limited without intravenous contrast. 4. Aortic atherosclerosis (ICD10-I70.0). 5. Emphysema (ICD10-J43.9). Electronically Signed   By: Titus Dubin M.D.   On: 10/08/2018 19:52   US Biopsy (liver)  Result Date: 10/09/2018 INDICATION: 85 year old with large abdominal mass, lymphadenopathy and probable liver lesion. Patient needs a tissue diagnosis. EXAM: ULTRASOUND-GUIDED LIVER LESION BIOPSY MEDICATIONS: None. ANESTHESIA/SEDATION: Moderate (conscious) sedation was employed during this procedure. A total of Versed 1.0 mg and Fentanyl 50 mcg was administered intravenously. Moderate Sedation Time: 11 minutes. The patient's level of consciousness and vital signs were monitored continuously by radiology nursing throughout the procedure under my direct supervision. FLUOROSCOPY TIME:  None COMPLICATIONS: None immediate. PROCEDURE: Abdomen was evaluated with ultrasound. A liver lesion was selected  for percutaneous biopsy. Informed written consent was obtained from the patient after a thorough discussion of the procedural risks, benefits and alternatives. All questions were addressed. Maximal Sterile Barrier Technique was utilized including caps, mask, sterile gowns, sterile gloves, sterile drape, hand hygiene and skin antiseptic. A timeout was performed prior to the initiation of the procedure. Anterior right abdomen was prepped with chlorhexidine and sterile field was created. Skin and soft tissues anesthetized with 1% lidocaine. Using ultrasound guidance, 17 gauge coaxial needle directed into the liver lesion. Total of 4 core biopsies were obtained with an 18 gauge core device. Specimens placed in saline. 17 gauge needle was removed without complication. Bandage placed over the puncture site. FINDINGS: Large palpable soft tissue mass in the anterior abdomen. This mass corresponds with the recent CT findings. Not clear if the abdominal mass is associated with bowel. In addition, there is a hypoechoic lesion in the right hepatic lobe. Needle position confirmed within this hepatic lesion. Four core biopsies were obtained. Specimens placed in saline. No evidence for bleeding or hematoma formation following the hepatic lesion biopsy. Trace perihepatic ascites along the right lateral abdomen. Multiple enlarged upper abdominal lymph nodes. IMPRESSION: Ultrasound-guided core biopsies of a hepatic lesion. Electronically Signed   By: Markus Daft M.D.   On: 10/09/2018 13:27   Vas [place] Lower Extremity Venous (dvt)  Result Date: 10/08/2018  Lower Venous Study Indications: Pain.  Performing Technologist: Oliver Hum RVT  Examination Guidelines: A complete evaluation includes B-mode imaging, spectral Doppler, color Doppler, and power Doppler as needed of all accessible portions of each vessel. Bilateral testing is considered an integral part of a complete examination. Limited examinations for reoccurring indications  may be performed as noted.  +---------+---------------+---------+-----------+----------+-------+  RIGHT     Compressibility Phasicity Spontaneity Properties Summary  +---------+---------------+---------+-----------+----------+-------+  CFV       Full            Yes       Yes                             +---------+---------------+---------+-----------+----------+-------+  SFJ  Full                                                      +---------+---------------+---------+-----------+----------+-------+  FV Prox   Full                                                      +---------+---------------+---------+-----------+----------+-------+  FV Mid    Full                                                      +---------+---------------+---------+-----------+----------+-------+  FV Distal Full                                                      +---------+---------------+---------+-----------+----------+-------+  PFV       Full                                                      +---------+---------------+---------+-----------+----------+-------+  POP       Full            Yes       Yes                             +---------+---------------+---------+-----------+----------+-------+  PTV       Full                                                      +---------+---------------+---------+-----------+----------+-------+  PERO      Full                                                      +---------+---------------+---------+-----------+----------+-------+   +---------+---------------+---------+-----------+----------+-------+  LEFT      Compressibility Phasicity Spontaneity Properties Summary  +---------+---------------+---------+-----------+----------+-------+  CFV       Full            Yes       Yes                             +---------+---------------+---------+-----------+----------+-------+  SFJ       Full                                                       +---------+---------------+---------+-----------+----------+-------+  FV Prox   Full                                                      +---------+---------------+---------+-----------+----------+-------+  FV Mid    Full                                                      +---------+---------------+---------+-----------+----------+-------+  FV Distal Full                                                      +---------+---------------+---------+-----------+----------+-------+  PFV       Full                                                      +---------+---------------+---------+-----------+----------+-------+  POP       Full            Yes       Yes                             +---------+---------------+---------+-----------+----------+-------+  PTV       Full                                                      +---------+---------------+---------+-----------+----------+-------+  PERO      Full                                                      +---------+---------------+---------+-----------+----------+-------+     Summary: Right: There is no evidence of deep vein thrombosis in the lower extremity. No cystic structure found in the popliteal fossa. Left: There is no evidence of deep vein thrombosis in the lower extremity. No cystic structure found in the popliteal fossa.  *See table(s) above for measurements and observations. Electronically signed by Deitra Mayo MD on 10/08/2018 at 7:02:13 PM.    Final     ASSESSMENT & PLAN:  CLYDELL ALBERTS is a 85 y.o. female with COPD and HTN  1. Metastatic adenocarcinoma to Liver and peritoneum, likely colon primary, MSI-H -I reviewed and discussed her CT AP and biopsy with her. She was found to have a 15cm mass in right abdomen, with liver and peritoneal involvement, with primary likely from right colon. Biopsy of liver lesion showed adenocarcinoma, IHC studies support colorectal primary. Her casa was discussed in our tumor board. -Unfortunately her cancer  is not resectable.  -I discussed her metastatic cancer is stage IV and not curable.  -Given her advanced  age, very poor performance status with severe LE edema, large abdominal mass and difficulty ambulating and traveling, she is not a good candidate for chemotherapy.  However, her tumor has loss of MLH1 and PMS2, so likely MSI-H, she could be a candidate for immunotherapy. Will order MSI and genomic sequencing [facility] -Pt seems to have low healthy literacy and I am not sure how much she was able to understand and digest the above info. Her son was not available, despite our GI navigator multiple phone calls to both pt and her son to arrange her today's appointment.  -I discussed the option of treating her symptoms with palliative care and hospice to help her quality of life. However pt does not seem to understand what's involved, and not able to make a decision.  -She plans to see her new PCP NP Christa See tomorrow in her office. I called [facility] and gave her heads-up, and she will discuss palliative care with her also.   -F/u open. If she wishes to see me in our clinic, I will be happy to arrange.    2. Right abdominal pain  -Secondary to #1 -She does not feel she needs pain medication at this time.  -I encouraged her to contact me if her pain worsens and she needs    3. Severe LE edema with Cellulitis  -She has weeping open wounds of b/l legs, I am not able to see it due to the limit of phone visit  -I discussed this is possibly from her abdominal mass compression her lymphatic system and causes lymphoedema in her legs.  -She is currently on oral doxycycline without much change.  -Per patient she is not active but can still ambulate on her own when needed. I encourage her to use a walker    PLAN:  -f/u open, as needed -she will see her new PCP Christa See tomorrow   No orders of the defined types were placed in this encounter.   All questions were answered. The patient knows to  call the clinic with any problems, questions or concerns. I spent 30 minutes counseling the patient face to face. The total time spent in the appointment was 40 minutes and more than 50% was on counseling.  I discussed the assessment and treatment plan with the patient. The patient was provided an opportunity to ask questions and all were answered. The patient agreed with the plan and demonstrated an understanding of the instructions.  The patient was advised to call back or seek an in-person evaluation if the symptoms worsen or if the condition fails to improve as anticipated.  I provided 40 minutes of non face-to-face telephone visit time during this encounter, and > 50% was spent counseling as documented under my assessment & plan.     Truitt Merle, MD 10/17/2018   I, Joslyn Devon, am acting as scribe for Truitt Merle, MD.   I have reviewed the above documentation for accuracy and completeness, and I agree with the above.

## 2018-10-17 ENCOUNTER — Inpatient Hospital Stay: Payer: Medicare Other | Attending: Hematology | Admitting: Hematology

## 2018-10-17 ENCOUNTER — Encounter: Payer: Self-pay | Admitting: Hematology

## 2018-10-17 DIAGNOSIS — G893 Neoplasm related pain (acute) (chronic): Secondary | ICD-10-CM | POA: Diagnosis not present

## 2018-10-17 DIAGNOSIS — Z87891 Personal history of nicotine dependence: Secondary | ICD-10-CM

## 2018-10-17 DIAGNOSIS — C189 Malignant neoplasm of colon, unspecified: Secondary | ICD-10-CM | POA: Diagnosis not present

## 2018-10-17 DIAGNOSIS — C787 Secondary malignant neoplasm of liver and intrahepatic bile duct: Secondary | ICD-10-CM | POA: Diagnosis not present

## 2018-10-17 DIAGNOSIS — I10 Essential (primary) hypertension: Secondary | ICD-10-CM

## 2018-10-17 DIAGNOSIS — J449 Chronic obstructive pulmonary disease, unspecified: Secondary | ICD-10-CM | POA: Diagnosis not present

## 2018-10-18 ENCOUNTER — Telehealth: Payer: Self-pay

## 2018-10-18 NOTE — Telephone Encounter (Signed)
Faxed OV note from yesterday to her PCP Dr. Myriam Jacobson at [facility] Fax [phone]

## 2018-10-22 ENCOUNTER — Telehealth: Payer: Self-pay | Admitting: Hematology

## 2018-10-22 NOTE — Telephone Encounter (Signed)
No los per 5/21.

## 2018-10-29 ENCOUNTER — Telehealth: Payer: Self-pay | Admitting: Hematology

## 2018-10-29 NOTE — Telephone Encounter (Signed)
I called pt yesterday for f/u. She is clinically stable. 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 She is interested and states her son can bring her in for visit and treatment. With her permission, I called her daughter Marianna Fuss and informed her about the above discussion. Marianna Fuss is very concerned about her mother's overall poor condition, and not sure if she can come in for visit and treatment. She will talk to her mother and get back to me before I schedule her next appointment.   Per pt, Marianna Fuss will be her health care power of attorney, but she wants to make her own medical decision as long as she can.   Truitt Merle  10/29/2018

## 2018-11-04 ENCOUNTER — Telehealth: Payer: Self-pay | Admitting: Hematology

## 2018-11-04 NOTE — Telephone Encounter (Signed)
I called pt's daughter Marianna Fuss again to f/u the conversation we had last week. She did speak with her mother and her PCP Stanton Kidney, and pt has decided not to try immunotherapy. Kerri's main concerns is her comfort and gets care at home due to her difficulty to going to doctor's office. Stanton Kidney has set up palliative care/hospice team to meet pt at her home tomorrow. I supported her decision and she knows to call me if anything I can help her mother.   Truitt Merle  11/04/2018

## 2018-11-27 DEATH — deceased

## 2019-05-23 IMAGING — CT CT ABDOMEN AND PELVIS WITHOUT CONTRAST
2 of 5 series · 15 of 46 positions shown, 17 images · non-contrast
Comparison: CTA chest 02/26/2015

CLINICAL DATA: 84-year-old female with palpable abdominal mass.
Lower extremity swelling.

EXAM:
CT ABDOMEN AND PELVIS WITHOUT CONTRAST
TECHNIQUE: Multidetector CT imaging of the abdomen and pelvis was performed
following the standard protocol without IV contrast.

[Series 2: axial st · axial · 0.70mm/px · z∈[+1056,+1366]mm · 12 of 70 slices shown, 14 images]
[im 4/70  soft-tissue]
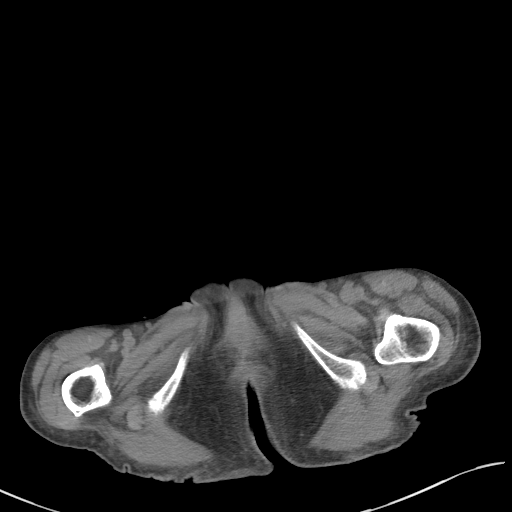
[im 4/70  bone]
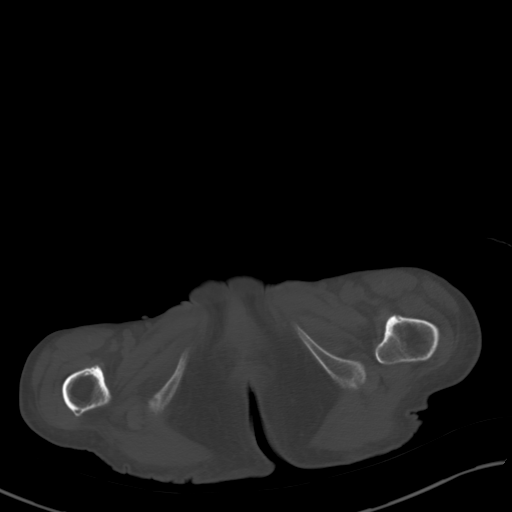
[im 10/70  soft-tissue]
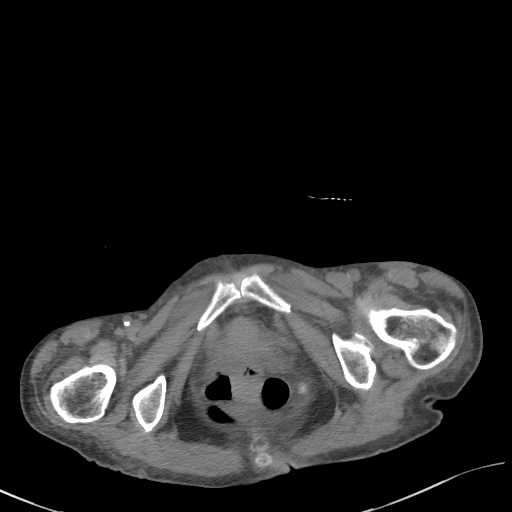
[im 17/70  soft-tissue]
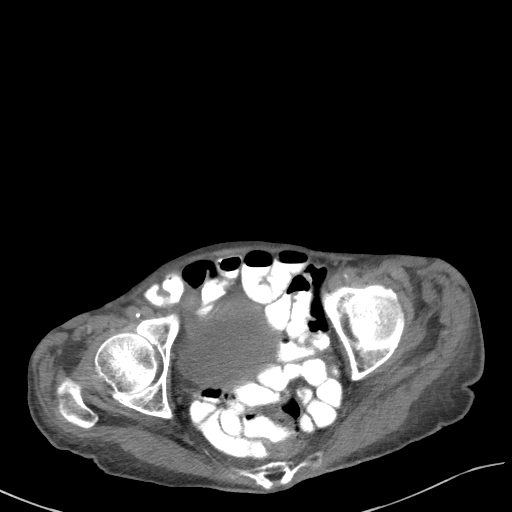
[im 20/70  soft-tissue]
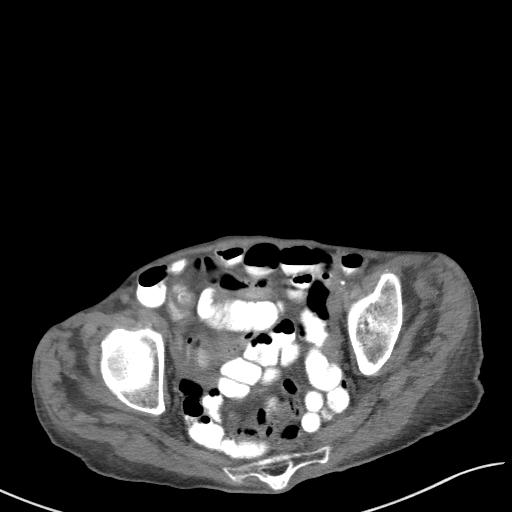
[im 27/70  soft-tissue]
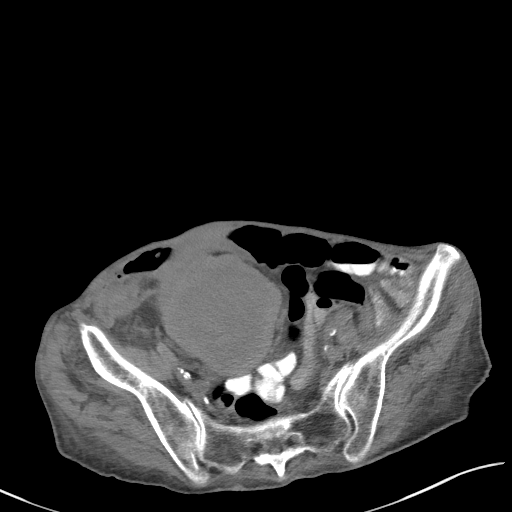
[im 33/70  soft-tissue]
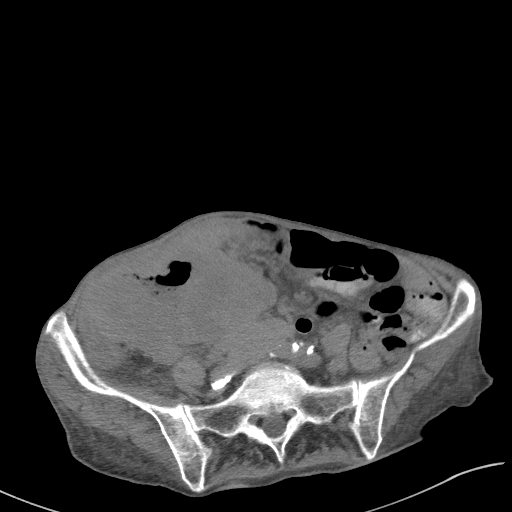
[im 37/70  soft-tissue]
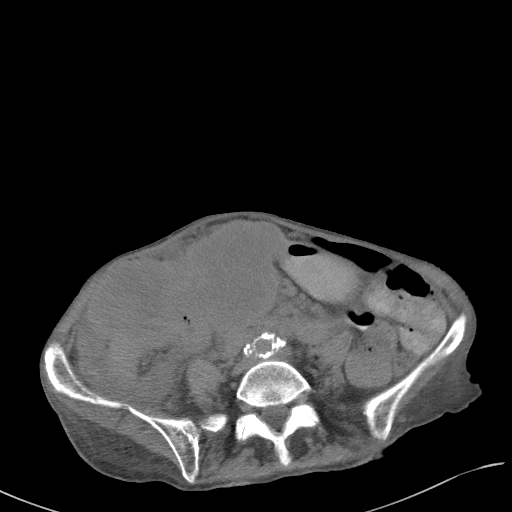
[im 43/70  soft-tissue]
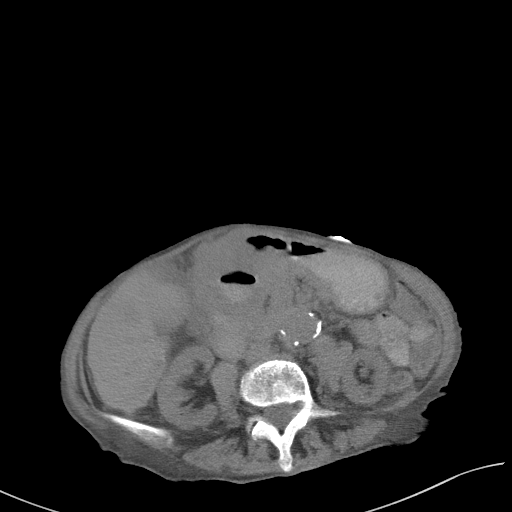
[im 50/70  soft-tissue]
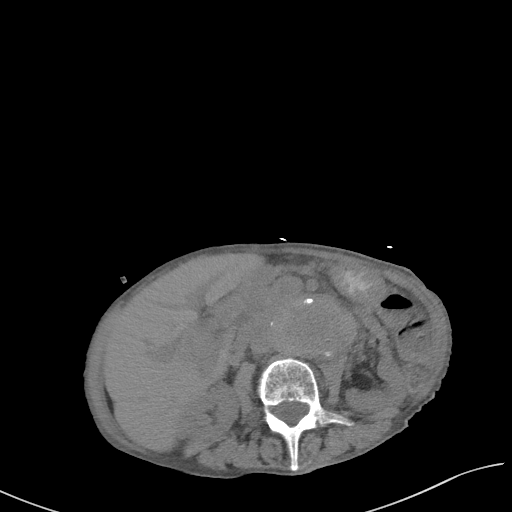
[im 50/70  bone]
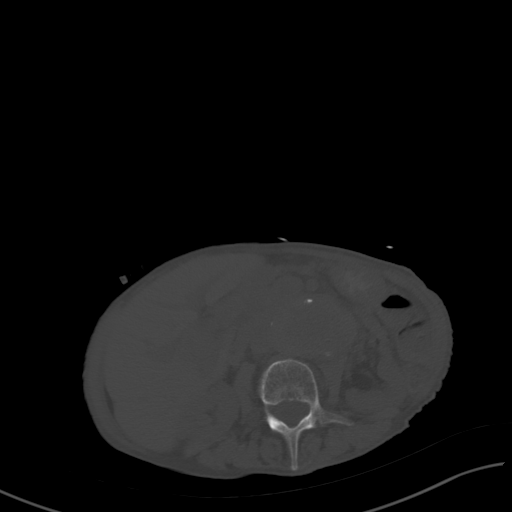
[im 53/70  soft-tissue]
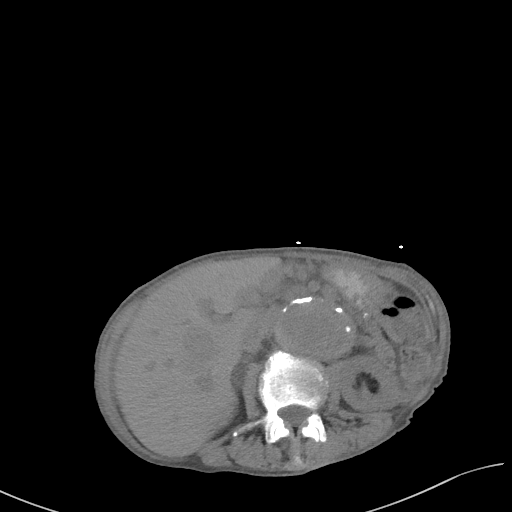
[im 60/70  soft-tissue]
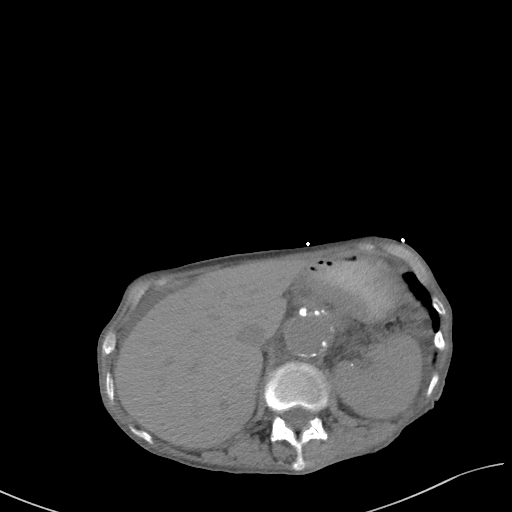
[im 66/70  soft-tissue]
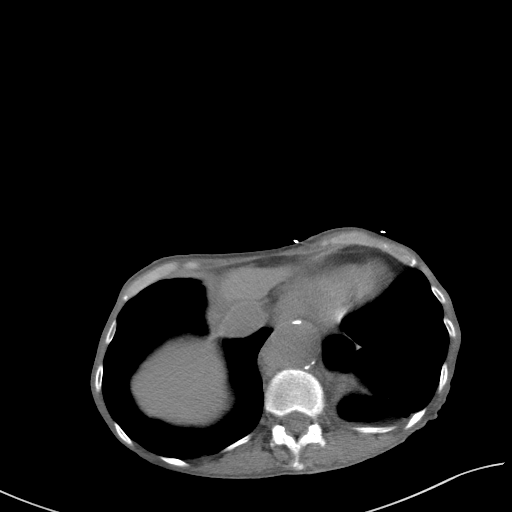

[Series 4: coronal st · coronal · 0.61mm/px · 3 of 64 slices shown]
[im 16/64  soft-tissue]
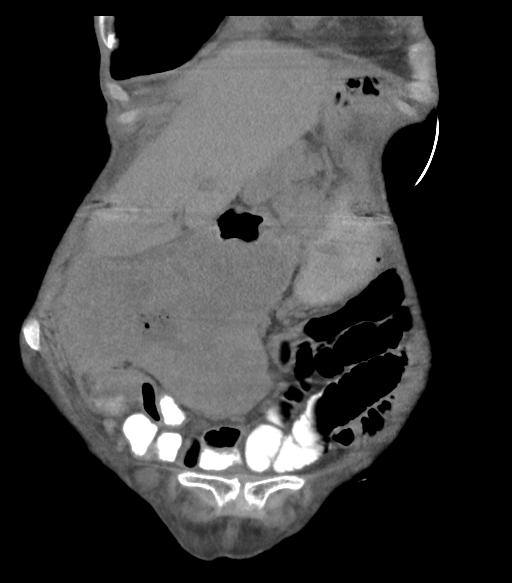
[im 32/64  soft-tissue]
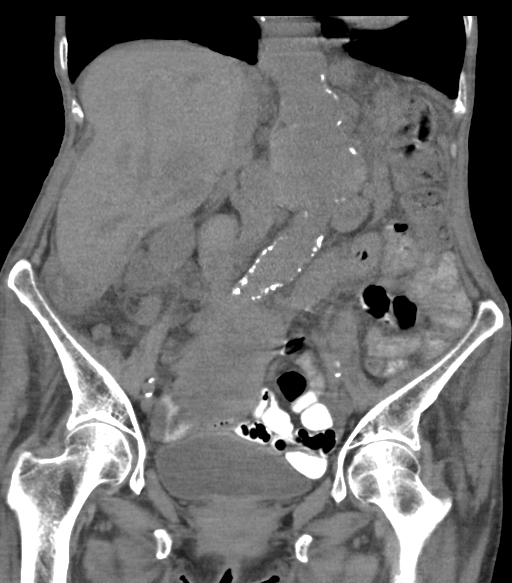
[im 48/64  soft-tissue]
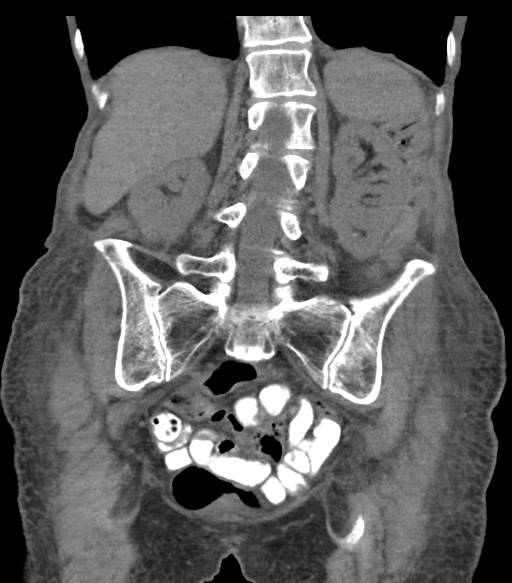

[15 of 46 positions shown; findings below may reference images not displayed]

FINDINGS: Lower chest: Negative.  No pericardial or pleural effusion.

Hepatobiliary: Trace perihepatic free fluid. Negative noncontrast
liver.

The gallbladder is visible on series 2, image 29 and is partially
inseparable from a large lobulated right abdominal mass. The mass
epicenter is in the right abdominal mesentery and tracks from the
inferior liver edge to the pelvic inlet encompassing 93 by 148 by
118 millimeters (AP by transverse by CC). The mass is inseparable
from multiple bowel loops, and might be contiguous with a bowel
lumen on series 2, image 41 (versus necrotic/cavitary changes within
the mass. Small volume superimposed right gutter free fluid.

Pancreas: Atrophied and difficult to delineate.

Spleen: Negative.

Adrenals/Urinary Tract: Both adrenal glands are difficult to
delineate. The kidneys are nonobstructed. No nephrolithiasis.
Diminutive and unremarkable urinary bladder.

Stomach/Bowel: There is a small volume of oral contrast in the
stomach and a larger volume of oral contrast in distal small bowel
loops throughout the lower abdomen and pelvis. These small bowel
loops define the caudal extent of the large lobulated abdominal mass
described earlier on series 2, image 47. The mass appears to be
intraperitoneal. The right colon and hepatic flexure are poorly
delineated. The transverse colon is redundant. There are no dilated
bowel loops. No free intraperitoneal air identified.

Vascular/Lymphatic: Aortoiliac calcified atherosclerosis. There is a
juxta renal abdominal aortic aneurysm (series 2, image 20) estimated
at 54 millimeters diameter. However, there is also abnormal nodular
soft tissue bordering the aorta which renders some of the aortic
contours difficult to delineate.

There is chronic ectasia of the descending thoracic aorta and aorta
at the hiatus of 35 millimeters. Vascular patency is not evaluated
in the absence of IV contrast.

Retrocrural and porta hepatis lymphadenopathy. Some of the
individual nodes are difficult to delineate, but enlarged nodes are
at least 11-17 millimeters short axis.

Reproductive: Surgically absent uterus. There may be small ovaries
along both pelvic sidewalls on series 2, image 49, uncertain.

Other: Questionable trace pelvic free fluid on series 2, image 58.

Musculoskeletal: No acute or suspicious osseous lesion.
IMPRESSION: 1. Large, lobulated 15 centimeter mass located in the right abdomen
is inseparable from large bowel as well as the gallbladder and
inferior liver edge. The epicenter is within the right abdominal
mesentery. A portion of the mass may be necrotic and/or communicate
with the bowel lumen (series 2, image 41).
In conjunction with lymphadenopathy (see #2) the top differential
considerations are Lymphoma (slightly favored), colon carcinoma, and
less likely ovarian carcinoma.
If the patient cannot receive CT IV contrast for better delineation
then Abdomen MRI may be valuable.
2. Retrocrural, retroperitoneal, and right upper quadrant
lymphadenopathy - some of which is inseparable from a juxta renal
aortic aneurysm (see #3).
3. Juxta renal Abdominal Aortic Aneurysm estimated at 54 mm
diameter, but partially obscured by surrounding lymphadenopathy.
Recommend vascular surgery consultation.
4. Small volume free fluid. No definite free air. No bowel
obstruction.

## 2019-05-23 IMAGING — CT CT CHEST WITHOUT CONTRAST
2 of 3 series · 14 of 36 positions shown, 17 images · non-contrast
Comparison: CT chest dated February 26, 2015.

CLINICAL DATA: New abdominal mass.

EXAM:
CT CHEST WITHOUT CONTRAST
TECHNIQUE: Multidetector CT imaging of the chest was performed following the
standard protocol without IV contrast.

[Series 2: thorax · axial · 0.64mm/px · z∈[+435,+767]mm · 11 of 196 slices shown, 14 images]
[im 15/196  mediastinal]
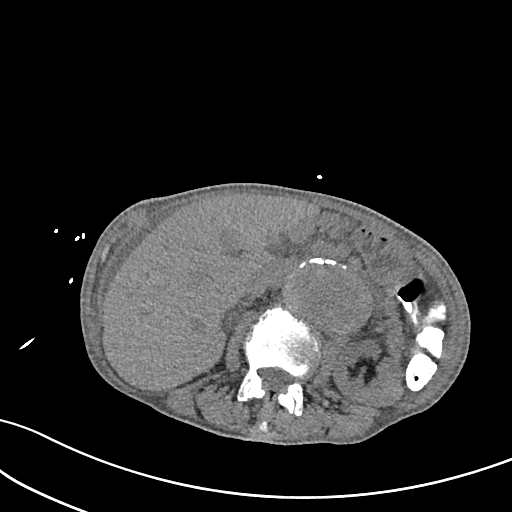
[im 15/196  lung]
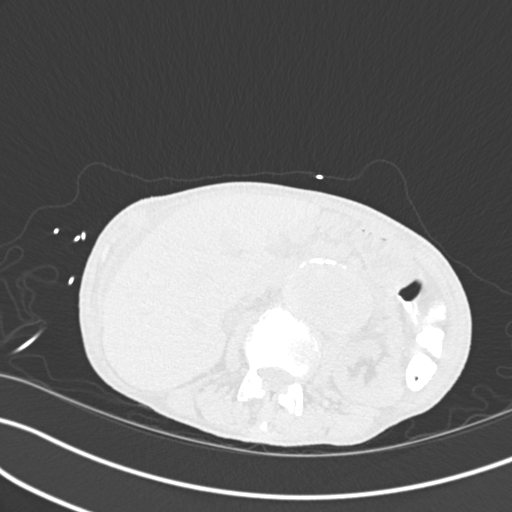
[im 29/196  lung]
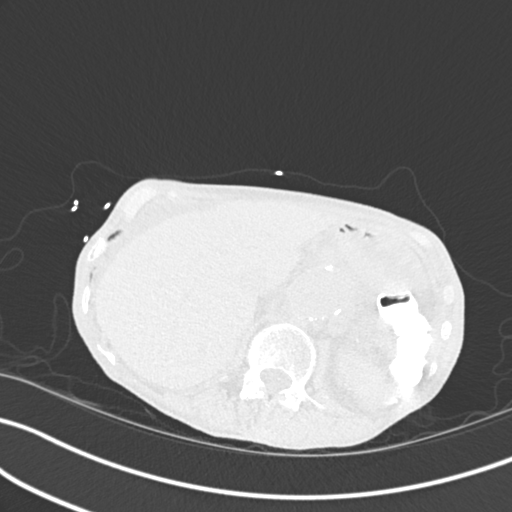
[im 44/196  lung]
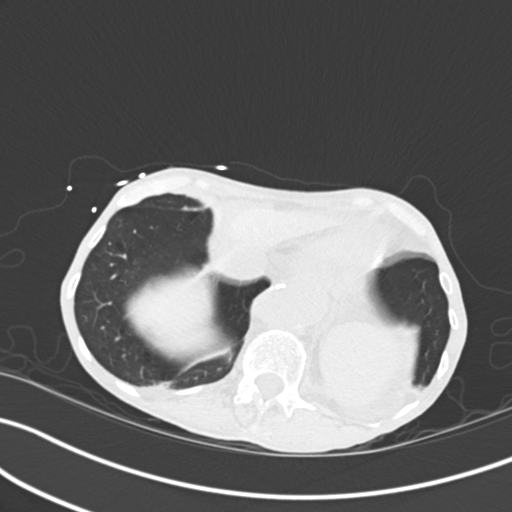
[im 66/196  lung]
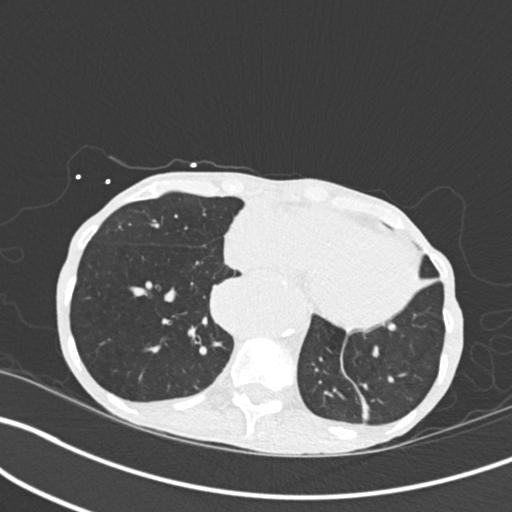
[im 80/196  mediastinal]
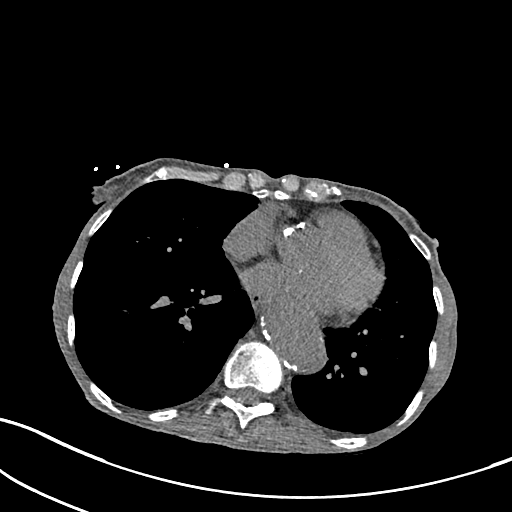
[im 80/196  lung]
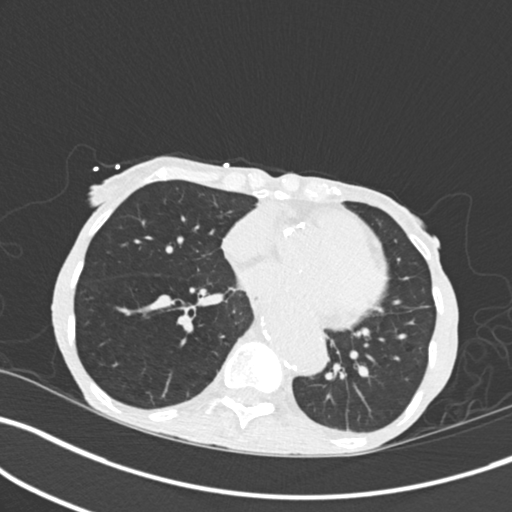
[im 102/196  lung]
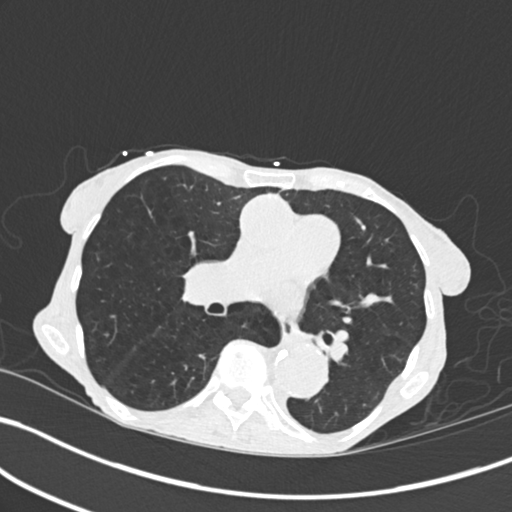
[im 116/196  lung]
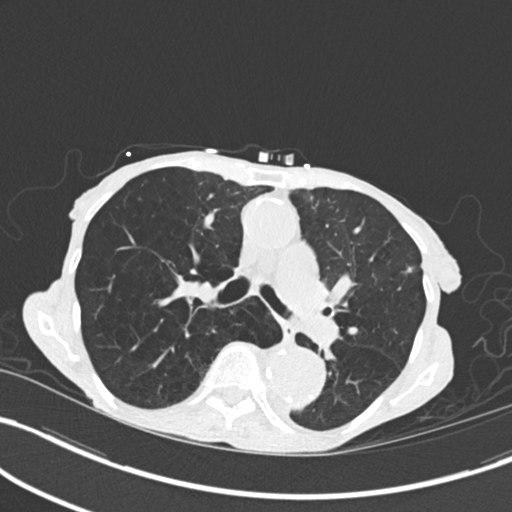
[im 131/196  lung]
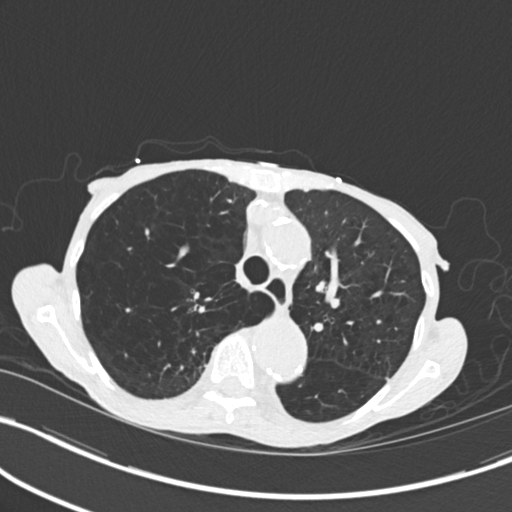
[im 152/196  mediastinal]
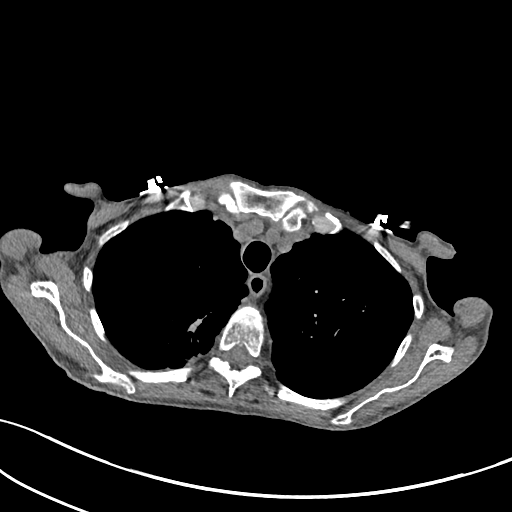
[im 152/196  lung]
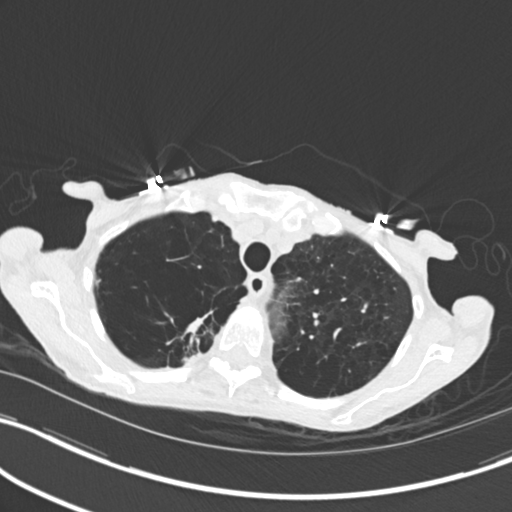
[im 167/196  lung]
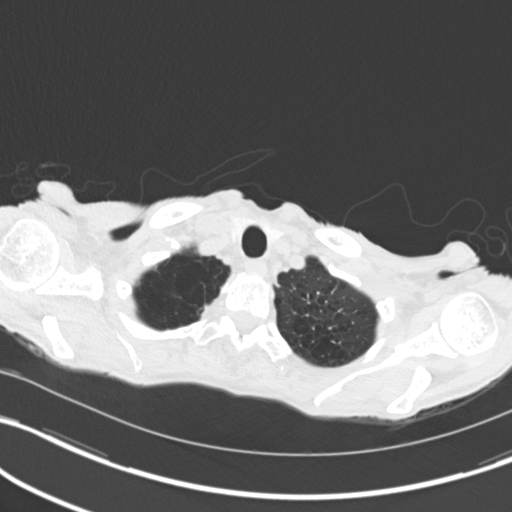
[im 181/196  lung]
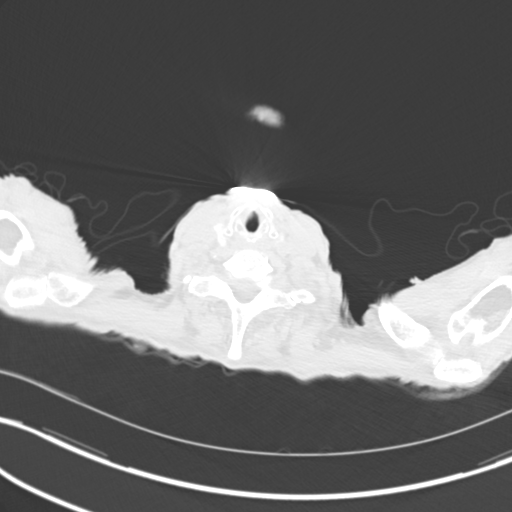

[Series 6: coronal · coronal · 0.56mm/px · 3 of 100 slices shown]
[im 20/100  lung]
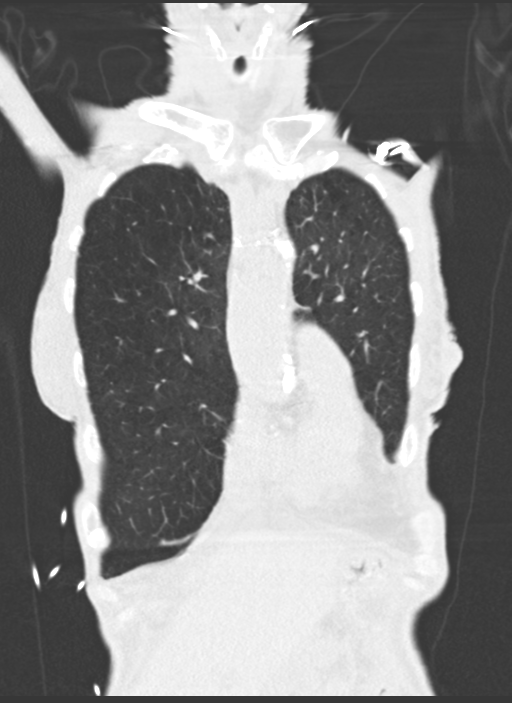
[im 40/100  lung]
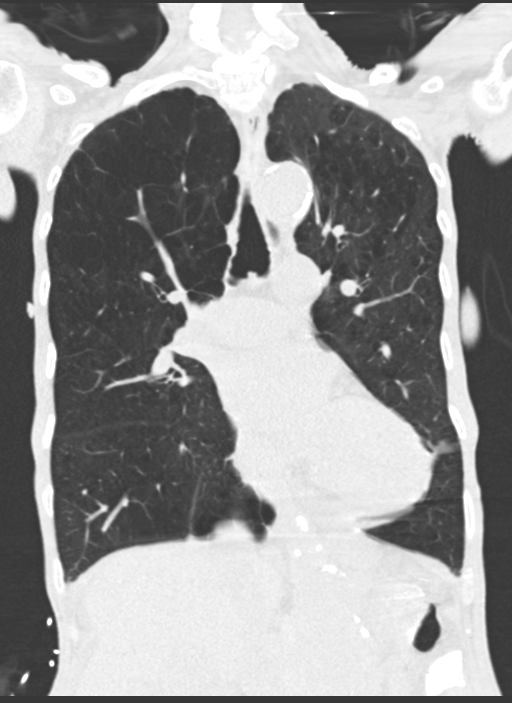
[im 60/100  lung]
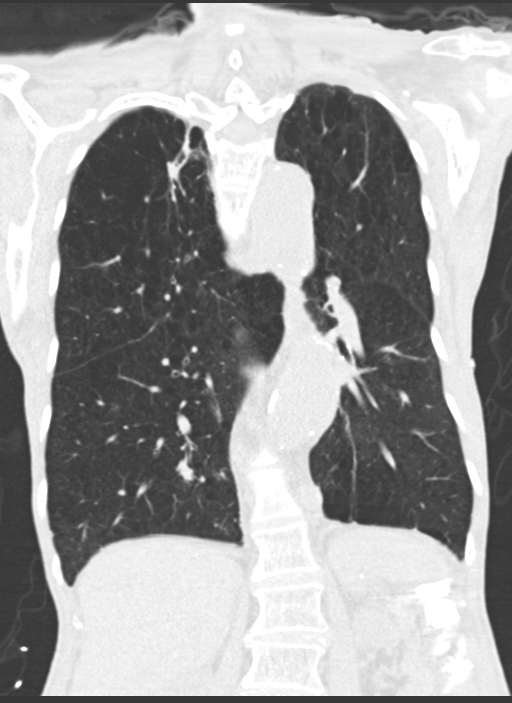

[14 of 36 positions shown; findings below may reference images not displayed]

FINDINGS: Cardiovascular: Normal heart size. No pericardial effusion.
Coronary, aortic arch, and branch vessel atherosclerotic vascular
disease. Localized saccular dilatations along the posterior aspect
of the proximal and mid descending aorta have mildly increased in
size since 1392. For example, the dilatation along the mid
descending thoracic aorta previously measured 1.0 x 1.4 x 1.2 cm,
and now measures 1.0 x 1.7 x 1.6 cm. Similarly, focal rightward
saccular dilatation along the distal descending thoracic aorta has
also increased in size, now measuring 3.9 x 3.2 cm, previously 2.5 x
2.1 cm.

Mediastinum/Nodes: Enlarged lower periaortic lymph nodes measuring
up to 1.3 cm in short axis. No enlarged mediastinal or axillary
lymph nodes. Thyroid gland, trachea, and esophagus demonstrate no
significant findings.

Lungs/Pleura: Severe centrilobular emphysema. New linear, slightly
nodular density in the posterior right upper lobe is favored to
reflect scarring. Unchanged scarring in the left lower lobe. New 6
mm slightly spiculated nodule in the left upper lobe (series 5,
image 80). New 8 x 7 mm spiculated nodule in the right lower lobe
(series 5, image 134). No focal consolidation, pleural effusion, or
pneumothorax.

Upper Abdomen: Please see separate CT abdomen pelvis report from
same day.

Musculoskeletal: No chest wall mass or suspicious bone lesions
identified.
IMPRESSION: 1. Lower para-aortic lymphadenopathy measuring up to 1.3 cm.
2. New 8 mm right lower lobe and 6 mm left upper lobe spiculated
nodules. These could represent metastases or synchronous primary
bronchogenic carcinomas. Follow-up non-contrast chest CT at 3-6
months is recommended.
3. Enlarging saccular aneurysm versus penetrating ulcer along the
distal descending thoracic aorta, measuring 3.9 x 3.2 cm, previously
2.5 x 2.1 cm. Additional focal saccular dilatations along the
proximal and mid descending aorta have also mildly increased in
size. Evaluation is limited without intravenous contrast.
4. Aortic atherosclerosis (O60UG-M5X.X).
5. Emphysema (O60UG-DOH.S).

## 2019-05-24 IMAGING — US ULTRASOUND BIOPSY CORE LIVER
1 series · 13 of 25 positions shown · non-contrast
Comparison: none

INDICATION: 84-year-old with large abdominal mass, lymphadenopathy and probable
liver lesion. Patient needs a tissue diagnosis.

[Series 1: ultrasound biopsy core liver · 0.22mm/px · 13 of 25 slices shown]
[im 1/25]
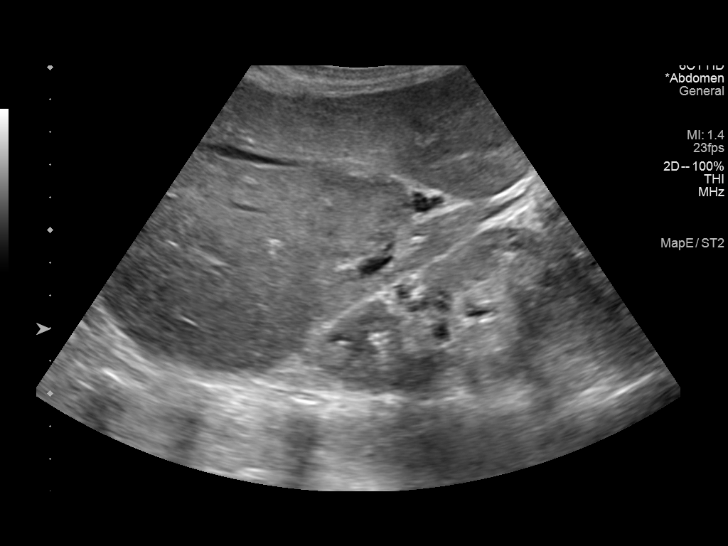
[im 3/25]
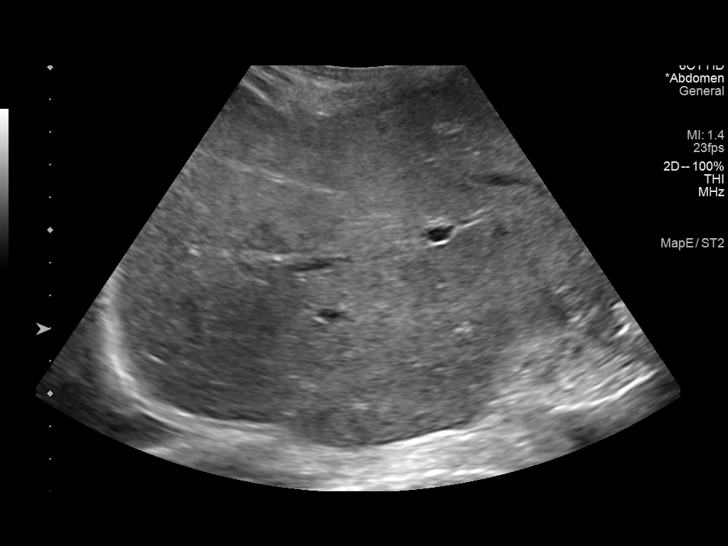
[im 5/25]
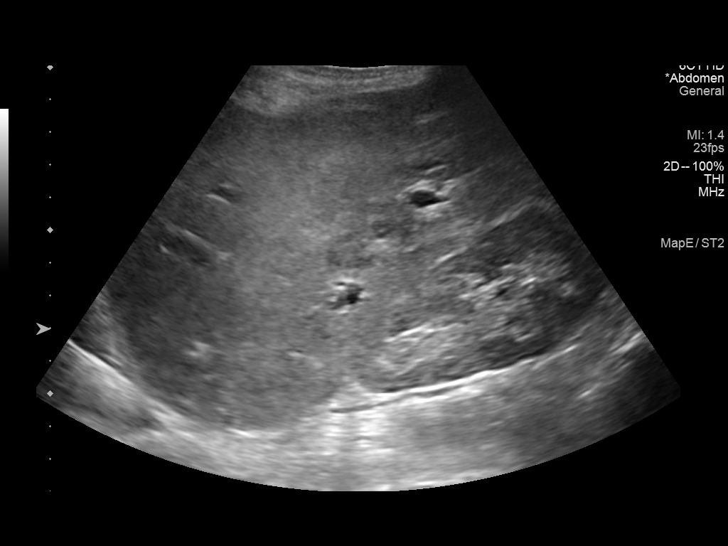
[im 7/25]
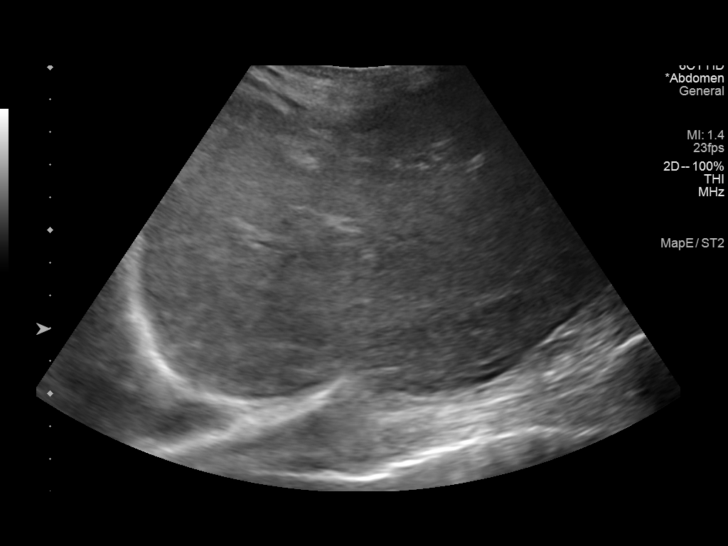
[im 9/25]
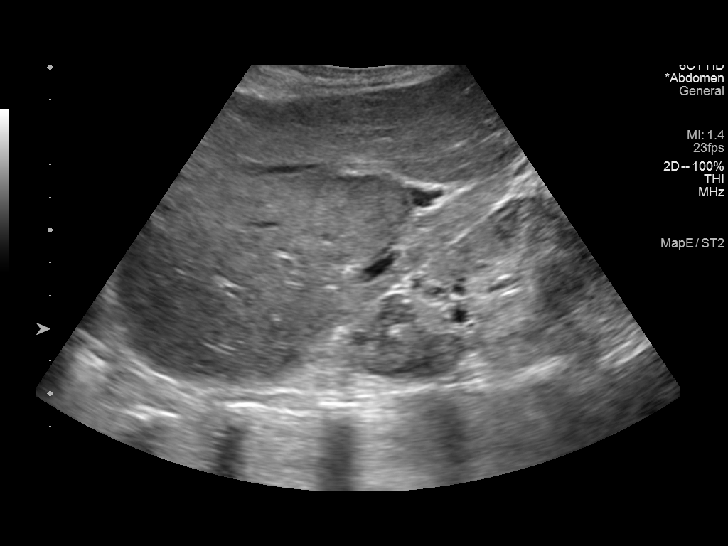
[im 11/25]
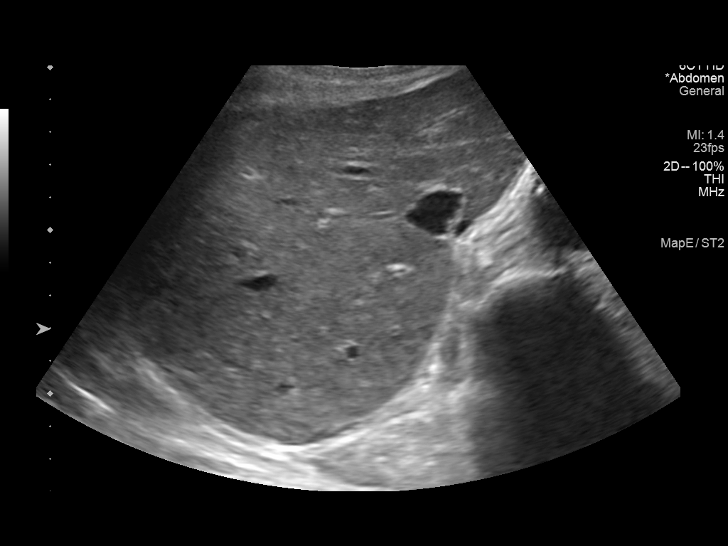
[im 13/25]
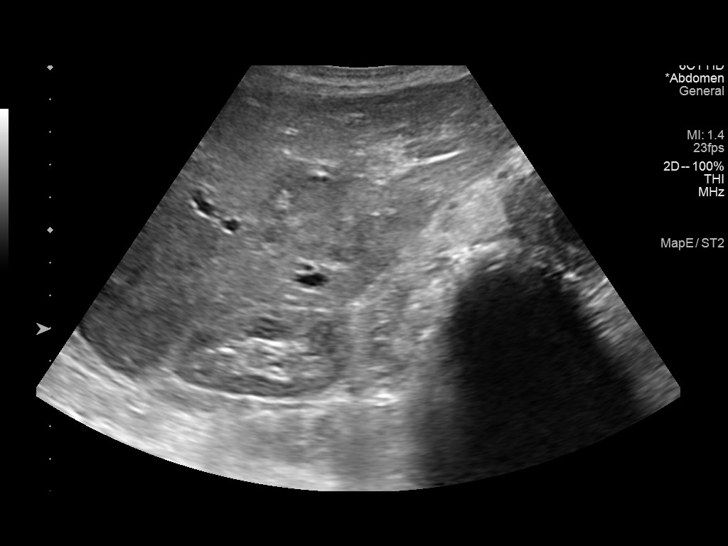
[im 15/25]
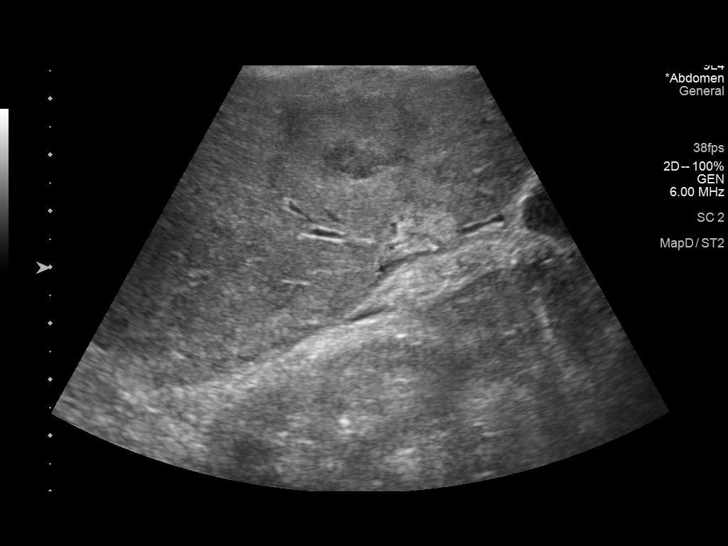
[im 17/25]
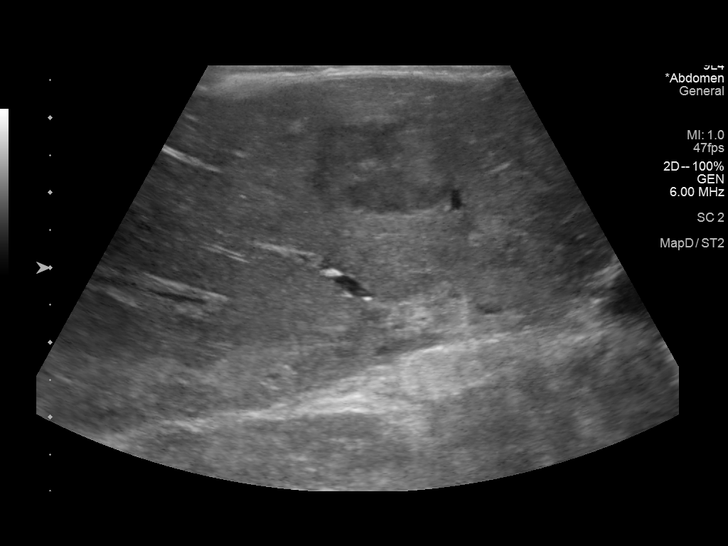
[im 19/25]
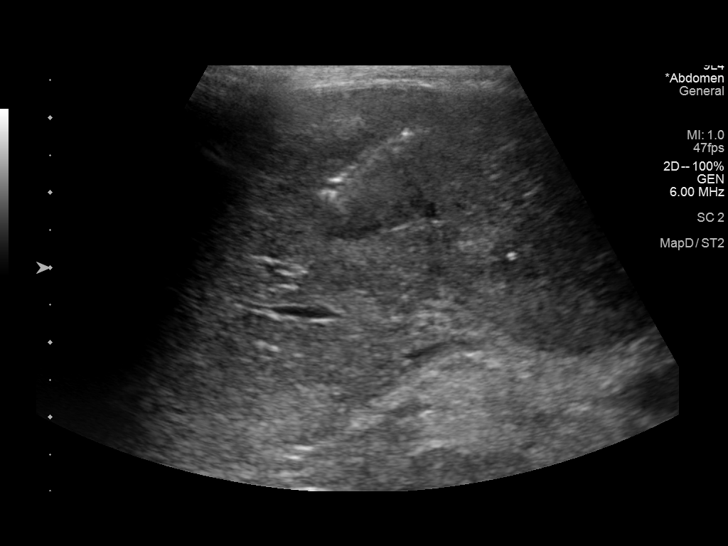
[im 21/25]
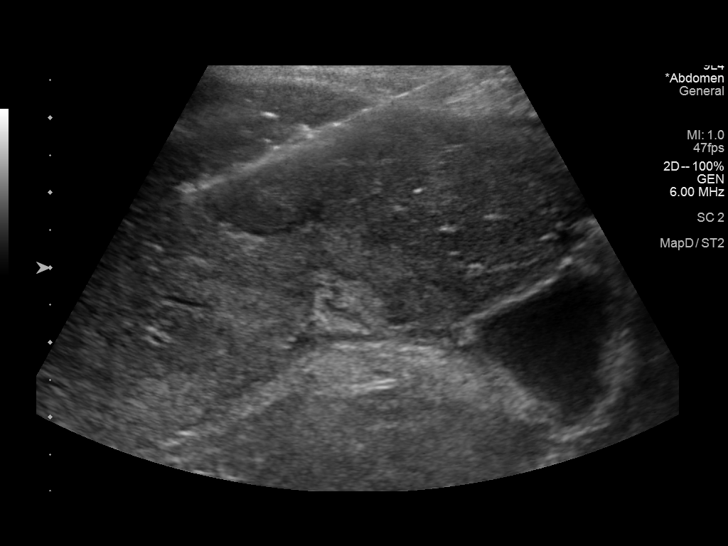
[im 23/25]
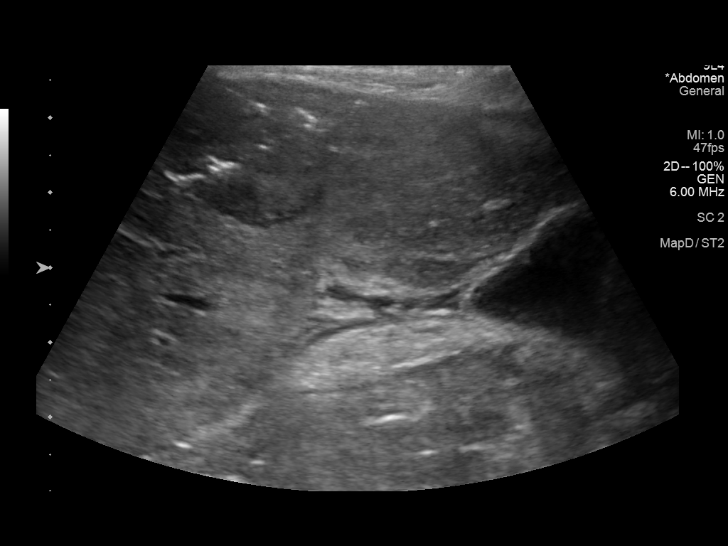
[im 25/25]
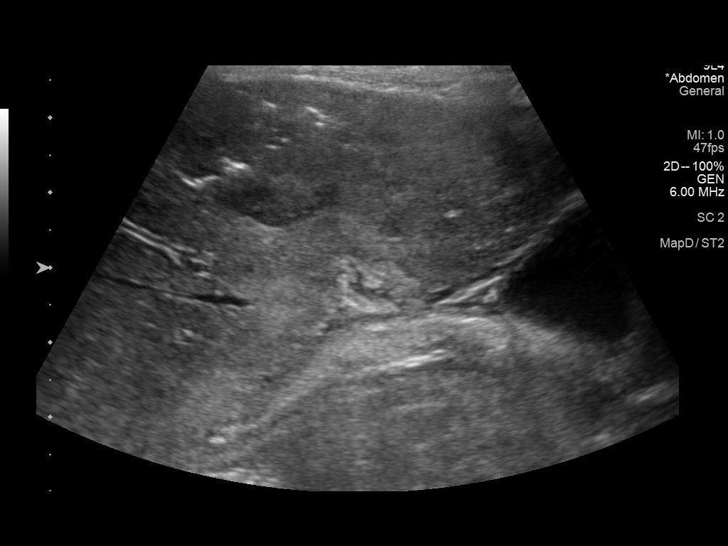

[13 of 25 positions shown; findings below may reference images not displayed]

EXAM:
ULTRASOUND-GUIDED LIVER LESION BIOPSY

MEDICATIONS:
None.

ANESTHESIA/SEDATION:
Moderate (conscious) sedation was employed during this procedure. A
total of Versed 1.0 mg and Fentanyl 50 mcg was administered
intravenously.

Moderate Sedation Time: 11 minutes. The patient's level of
consciousness and vital signs were monitored continuously by
radiology nursing throughout the procedure under my direct
supervision.

FLUOROSCOPY TIME:  None

COMPLICATIONS:
None immediate.

PROCEDURE:
Abdomen was evaluated with ultrasound. A liver lesion was selected
for percutaneous biopsy.

Informed written consent was obtained from the patient after a
thorough discussion of the procedural risks, benefits and
alternatives. All questions were addressed. Maximal Sterile Barrier
Technique was utilized including caps, mask, sterile gowns, sterile
gloves, sterile drape, hand hygiene and skin antiseptic. A timeout
was performed prior to the initiation of the procedure.

Anterior right abdomen was prepped with chlorhexidine and sterile
field was created. Skin and soft tissues anesthetized with 1%
lidocaine. Using ultrasound guidance, 17 gauge coaxial needle
directed into the liver lesion. Total of 4 core biopsies were
obtained with an 18 gauge core device. Specimens placed in saline.
17 gauge needle was removed without complication. Bandage placed
over the puncture site.
FINDINGS: Large palpable soft tissue mass in the anterior abdomen. This mass
corresponds with the recent CT findings. Not clear if the abdominal
mass is associated with bowel. In addition, there is a hypoechoic
lesion in the right hepatic lobe. Needle position confirmed within
this hepatic lesion. Four core biopsies were obtained. Specimens
placed in saline. No evidence for bleeding or hematoma formation
following the hepatic lesion biopsy.

Trace perihepatic ascites along the right lateral abdomen.

Multiple enlarged upper abdominal lymph nodes.
IMPRESSION: Ultrasound-guided core biopsies of a hepatic lesion.
